# Patient Record
Sex: Male | Born: 1985 | Hispanic: Yes | Marital: Single | Smoking: Never smoker
Health system: Southern US, Community
[De-identification: ages and names within clinical notes are randomized; demographics above are authoritative.]

---

## 2017-11-23 ENCOUNTER — Emergency Department

## 2017-11-23 ENCOUNTER — Other Ambulatory Visit: Payer: Self-pay

## 2017-11-23 ENCOUNTER — Emergency Department
Admission: EM | Admit: 2017-11-23 | Discharge: 2017-11-23 | Attending: Emergency Medicine | Admitting: Emergency Medicine

## 2017-11-23 DIAGNOSIS — S0240FA Zygomatic fracture, left side, initial encounter for closed fracture: Secondary | ICD-10-CM | POA: Diagnosis not present

## 2017-11-23 DIAGNOSIS — Y92149 Unspecified place in prison as the place of occurrence of the external cause: Secondary | ICD-10-CM | POA: Diagnosis not present

## 2017-11-23 DIAGNOSIS — Y939 Activity, unspecified: Secondary | ICD-10-CM | POA: Diagnosis not present

## 2017-11-23 DIAGNOSIS — S0990XA Unspecified injury of head, initial encounter: Secondary | ICD-10-CM | POA: Diagnosis present

## 2017-11-23 DIAGNOSIS — S01112A Laceration without foreign body of left eyelid and periocular area, initial encounter: Secondary | ICD-10-CM | POA: Diagnosis not present

## 2017-11-23 DIAGNOSIS — Z23 Encounter for immunization: Secondary | ICD-10-CM | POA: Diagnosis not present

## 2017-11-23 DIAGNOSIS — Y999 Unspecified external cause status: Secondary | ICD-10-CM | POA: Diagnosis not present

## 2017-11-23 DIAGNOSIS — S0181XA Laceration without foreign body of other part of head, initial encounter: Secondary | ICD-10-CM

## 2017-11-23 DIAGNOSIS — S0083XA Contusion of other part of head, initial encounter: Secondary | ICD-10-CM

## 2017-11-23 MED ORDER — TETANUS-DIPHTH-ACELL PERTUSSIS 5-2.5-18.5 LF-MCG/0.5 IM SUSP
0.5000 mL | Freq: Once | INTRAMUSCULAR | Status: AC
  Administered 2017-11-23: 0.5 mL via INTRAMUSCULAR
  Filled 2017-11-23: qty 0.5

## 2017-11-23 MED ORDER — HYDROMORPHONE HCL 1 MG/ML IJ SOLN
2.0000 mg | Freq: Once | INTRAMUSCULAR | Status: AC
  Administered 2017-11-23: 2 mg via INTRAMUSCULAR
  Filled 2017-11-23: qty 2

## 2017-11-23 MED ORDER — OXYCODONE-ACETAMINOPHEN 5-325 MG PO TABS
2.0000 | ORAL_TABLET | Freq: Once | ORAL | Status: AC
  Administered 2017-11-23: 2 via ORAL
  Filled 2017-11-23: qty 2

## 2017-11-23 MED ORDER — LIDOCAINE-EPINEPHRINE 1 %-1:100000 IJ SOLN
10.0000 mL | Freq: Once | INTRAMUSCULAR | Status: DC
  Administered 2017-11-23: 10 mL via INTRADERMAL
  Filled 2017-11-23: qty 10

## 2017-11-23 MED ORDER — IBUPROFEN 600 MG PO TABS
600.0000 mg | ORAL_TABLET | Freq: Once | ORAL | Status: AC
  Administered 2017-11-23: 600 mg via ORAL
  Filled 2017-11-23: qty 1

## 2017-11-23 MED ORDER — LIDOCAINE-EPINEPHRINE (PF) 2 %-1:200000 IJ SOLN: 10.0000 mL | Freq: Once | INTRAMUSCULAR | Status: DC

## 2017-11-23 MED ORDER — LIDOCAINE-EPINEPHRINE (PF) 1 %-1:200000 IJ SOLN
INTRAMUSCULAR | Status: AC
  Filled 2017-11-23: qty 30

## 2017-11-23 NOTE — ED Notes (Addendum)
Denies loss of consciousness - denies change in vision - c/o nausea and abd pain "feel strange" - reports dizziness Interpreter is at bedside for triage and assessment - Dr Lamont Snowballifenbark is also at bedside - Dr Lamont Snowballifenbark removed C-Collar
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
## 2017-11-23 NOTE — ED Notes (Signed)
Lidocaine VO was for lidocaine with epi 1% 1:200000

## 2017-11-23 NOTE — ED Notes (Signed)
C Collar applied on arrival.
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
## 2023-01-20 ENCOUNTER — Emergency Department
Admission: EM | Admit: 2023-01-20 | Discharge: 2023-01-21 | Disposition: A | Discharge status: Other Acute Inpatient Hospital | Attending: Emergency Medicine | Admitting: Emergency Medicine

## 2023-01-20 ENCOUNTER — Other Ambulatory Visit: Payer: Self-pay

## 2023-01-20 DIAGNOSIS — F23 Brief psychotic disorder: Secondary | ICD-10-CM | POA: Insufficient documentation

## 2023-01-20 DIAGNOSIS — F29 Unspecified psychosis not due to a substance or known physiological condition: Secondary | ICD-10-CM

## 2023-01-20 DIAGNOSIS — R45851 Suicidal ideations: Secondary | ICD-10-CM | POA: Insufficient documentation

## 2023-01-20 LAB — URINE DRUG SCREEN, QUALITATIVE (ARMC ONLY)
Amphetamines, Ur Screen: NOT DETECTED
Barbiturates, Ur Screen: NOT DETECTED
Benzodiazepine, Ur Scrn: NOT DETECTED
Cannabinoid 50 Ng, Ur ~~LOC~~: POSITIVE — AB
Cocaine Metabolite,Ur ~~LOC~~: NOT DETECTED
MDMA (Ecstasy)Ur Screen: NOT DETECTED
Methadone Scn, Ur: NOT DETECTED
Opiate, Ur Screen: NOT DETECTED
Phencyclidine (PCP) Ur S: NOT DETECTED
Tricyclic, Ur Screen: NOT DETECTED

## 2023-01-20 LAB — COMPREHENSIVE METABOLIC PANEL
ALT: 27 U/L (ref 0–44)
AST: 23 U/L (ref 15–41)
Albumin: 4.5 g/dL (ref 3.5–5.0)
Alkaline Phosphatase: 88 U/L (ref 38–126)
Anion gap: 12 (ref 5–15)
BUN: 25 mg/dL — ABNORMAL HIGH (ref 6–20)
CO2: 22 mmol/L (ref 22–32)
Calcium: 9.2 mg/dL (ref 8.9–10.3)
Chloride: 103 mmol/L (ref 98–111)
Creatinine, Ser: 1.2 mg/dL (ref 0.61–1.24)
GFR, Estimated: >60 mL/min (ref >60)
Glucose, Bld: 120 mg/dL — ABNORMAL HIGH (ref 70–99)
Potassium: 3.5 mmol/L (ref 3.5–5.1)
Sodium: 137 mmol/L (ref 135–145)
Total Bilirubin: 0.5 mg/dL (ref 0.3–1.2)
Total Protein: 8.1 g/dL (ref 6.5–8.1)

## 2023-01-20 LAB — CBC
HCT: 41.3 % (ref 39.0–52.0)
Hemoglobin: 13.7 g/dL (ref 13.0–17.0)
MCH: 28.6 pg (ref 26.0–34.0)
MCHC: 33.2 g/dL (ref 30.0–36.0)
MCV: 86.2 fL (ref 80.0–100.0)
Platelets: 367 10*3/uL (ref 150–400)
RBC: 4.79 MIL/uL (ref 4.22–5.81)
RDW: 12.9 % (ref 11.5–15.5)
WBC: 14.1 10*3/uL — ABNORMAL HIGH (ref 4.0–10.5)
nRBC: 0 % (ref 0.0–0.2)

## 2023-01-20 LAB — ETHANOL: Alcohol, Ethyl (B): <10 mg/dL (ref <10)

## 2023-01-20 LAB — SALICYLATE LEVEL: Salicylate Lvl: <7 mg/dL — ABNORMAL LOW (ref 7.0–30.0)

## 2023-01-20 LAB — ACETAMINOPHEN LEVEL: Acetaminophen (Tylenol), Serum: <10 ug/mL — ABNORMAL LOW (ref 10–30)

## 2023-01-20 NOTE — BH Assessment (Signed)
Comprehensive Clinical Assessment (CCA) Note  01/20/2023 [facility] Elijah Ryan [id number]  Chief Complaint: Patient is a 37 year old male presenting to [facility] ED under IVC. Per triage note Pt to ed from magistrates office under IVC via BPD. Family called and had papers taken out bc he was speaking out of his head. Pt stated things like " I'm going to take over the world". Pt is alert and tracking appropriately in triage. During assessment patient appears alert and oriented x2, cooperative but manic and anxious. Patient was assessed via interpreter. Patient reports why he is presenting to the ED "you guys brought me here, I want to go back to my country." Patients thoughts were disorganized during the assessment with the interpreter, patient was difficult to redirect when asked questions. Patient reports "my hands are free, right now my mother and daughter are sick, I don't make enough money here to support my mother and daughter." When asked about AH he reports "a lot of voices, I'm here to get a job, I crossed the border, you are not giving me a job, I came from [place]." When asked about SI he reports "I am waiting for Jesus to take me, I'm giving my soul to hell, I am golden." When asked about taking any medications he reports "I'm not sick." Unable to assess if patient is experiencing HI/VH due to patient's disorganized thoughts.  Per Psyc NP Lerry Liner patient is recommended for Inpatient Chief Complaint  Patient presents with   Psychiatric Evaluation    IVC   Visit Diagnosis: Acute Psychosis    CCA Screening, Triage and Referral (STR)  Patient Reported Information How did you hear about [place]? Legal System  Referral name: No data recorded Referral phone number: No data recorded  Whom do you see for routine medical problems? No data recorded Practice/Facility Name: No data recorded Practice/Facility Phone Number: No data recorded Name of Contact: No data recorded Contact Number: No  data recorded Contact Fax Number: No data recorded Prescriber Name: No data recorded Prescriber Address (if known): No data recorded  What Is the Reason for Your Visit/Call Today? Pt to ed from magistrates office under IVC via BPD. Family called and had papers taken out bc he was speaking out of his head. Pt stated things like " I'm going to take over the world". Pt is alert and tracking appropriately in triage.  How Long Has This Been Causing You Problems? > than 6 months  What Do You Feel Would Help You the Most Today? No data recorded  Have You Recently Been in Any Inpatient Treatment (Hospital/Detox/Crisis Center/28-Day Program)? No data recorded Name/Location of Program/Hospital:No data recorded How Long Were You There? No data recorded When Were You Discharged? No data recorded  Have You Ever Received Services From [facility] Before? No data recorded Who Do You See at [facility]? No data recorded  Have You Recently Had Any Thoughts About Hurting Yourself? -- (UTA)  Are You Planning to Commit Suicide/Harm Yourself At This time? -- (UTA)   Have you Recently Had Thoughts About Hurting Someone Else? -- (UTA)  Explanation: No data recorded  Have You Used Any Alcohol or Drugs in the Past 24 Hours? Yes  How Long Ago Did You Use Drugs or Alcohol? No data recorded What Did You Use and How Much? Marijuana   Do You Currently Have a Therapist/Psychiatrist? No  Name of Therapist/Psychiatrist: No data recorded  Have You Been Recently Discharged From Any Office Practice or Programs?  No  Explanation of Discharge From Practice/Program: No data recorded    CCA Screening Triage Referral Assessment Type of Contact: Face-to-Face  Is this Initial or Reassessment? No data recorded Date Telepsych consult ordered in [facility]:  No data recorded Time Telepsych consult ordered in [facility]:  No data recorded  Patient Reported Information Reviewed? No data recorded Patient Left Without Being Seen?  No data recorded Reason for Not Completing Assessment: No data recorded  Collateral Involvement: No data recorded  Does Patient Have a Court Appointed Legal Guardian? No data recorded Name and Contact of Legal Guardian: No data recorded If Minor and Not Living with Parent(s), Who has Custody? No data recorded Is CPS involved or ever been involved? Never  Is APS involved or ever been involved? Never   Patient Determined To Be At Risk for Harm To Self or Others Based on Review of Patient Reported Information or Presenting Complaint? No  Method: No data recorded Availability of Means: No data recorded Intent: No data recorded Notification Required: No data recorded Additional Information for Danger to Others Potential: No data recorded Additional Comments for Danger to Others Potential: No data recorded Are There Guns or Other Weapons in Your Home? No  Types of Guns/Weapons: No data recorded Are These Weapons Safely Secured?                            No data recorded Who Could Verify You Are Able To Have These Secured: No data recorded Do You Have any Outstanding Charges, Pending Court Dates, Parole/Probation? No data recorded Contacted To Inform of Risk of Harm To Self or Others: No data recorded  Location of Assessment:  [facility] ED   Does Patient Present under Involuntary Commitment? Yes  IVC Papers Initial File Date: No data recorded  [place] of Residence: [place]   Patient Currently Receiving the Following Services: No data recorded  Determination of Need: Emergent (2 hours)   Options For Referral: No data recorded    CCA Biopsychosocial Intake/Chief Complaint:  No data recorded Current Symptoms/Problems: No data recorded  Patient Reported Schizophrenia/Schizoaffective Diagnosis in Past: No   Strengths: Patient is able to communicate  Preferences: No data recorded Abilities: No data recorded  Type of Services Patient Feels are Needed: No data  recorded  Initial Clinical Notes/Concerns: No data recorded  Mental Health Symptoms Depression:   None   Duration of Depressive symptoms: No data recorded  Mania:   Change in energy/activity; Increased Energy; Racing thoughts; Recklessness   Anxiety:    Difficulty concentrating   Psychosis:   Delusions; Hallucinations   Duration of Psychotic symptoms:  Greater than six months   Trauma:   None   Obsessions:   Poor insight; Recurrent & persistent thoughts/impulses/images   Compulsions:   Absent insight/delusional; Poor Insight; Repeated behaviors/mental acts   Inattention:   Disorganized   Hyperactivity/Impulsivity:   None   Oppositional/Defiant Behaviors:   None   Emotional Irregularity:   Mood lability; Transient, stress-related paranoia/disassociation   Other Mood/Personality Symptoms:  No data recorded   Mental Status Exam Appearance and self-care  Stature:   Average   Weight:   Average weight   Clothing:   Casual   Grooming:   Normal   Cosmetic use:   None   Posture/gait:   Normal   Motor activity:   Not Remarkable   Sensorium  Attention:   Distractible   Concentration:   Anxiety interferes; Focuses on irrelevancies; Scattered  Orientation:   Person; Place   Recall/memory:   Defective in Short-term   Affect and Mood  Affect:   Anxious   Mood:   Anxious   Relating  Eye contact:   Normal   Facial expression:   Responsive   Attitude toward examiner:   Cooperative   Thought and Language  Speech flow:  Flight of Ideas   Thought content:   Delusions   Preoccupation:   Ruminations; Religion; Obsessions   Hallucinations:   Auditory   Organization:  No data recorded  [employer] of Knowledge:   Fair   Intelligence:   Average   Abstraction:   Functional   Judgement:   Poor   Reality Testing:   Distorted   Insight:   Lacking; Poor   Decision Making:   Impulsive   Social  Functioning  Social Maturity:   Impulsive   Social Judgement:   Heedless   Stress  Stressors:   Family conflict   Coping Ability:   Contractor Deficits:   None   Supports:   Family     Religion: Religion/Spirituality Are You A Religious Person?: Yes  Leisure/Recreation: Leisure / Recreation Do You Have Hobbies?: No  Exercise/Diet: Exercise/Diet Do You Exercise?: No Have You Gained or Lost A Significant Amount of Weight in the Past Six Months?: No Do You Follow a Special Diet?: No Do You Have Any Trouble Sleeping?:  (Unknown)   CCA Employment/Education Employment/Work Situation: Employment / Work Situation Employment Situation:  (Unknown) Patient's Job has Been Impacted by Current Illness: No Has Patient ever Been in the [employer]?:  Industrial/product designer)  Education: Education Is Patient Currently Attending School?: No Did You Have An Individualized Education Program (IIEP): No Did You Have Any Difficulty At School?: No Patient's Education Has Been Impacted by Current Illness: No   CCA Family/Childhood History Family and Relationship History: Family history Marital status: Single Does patient have children?:  (Unknown)  Childhood History:  Childhood History Did patient suffer any verbal/emotional/physical/sexual abuse as a child?:  (UTA) Did patient suffer from severe childhood neglect?:  (UTA) Has patient ever been sexually abused/assaulted/raped as an adolescent or adult?:  (UTA) Was the patient ever a victim of a crime or a disaster?:  (UTA) Witnessed domestic violence?:  (UTA) Has patient been affected by domestic violence as an adult?:  Industrial/product designer)  Child/Adolescent Assessment:     CCA Substance Use Alcohol/Drug Use: Alcohol / Drug Use Pain Medications: SEE MAR Prescriptions: SEE MAR Over the Counter: SEE MAR History of alcohol / drug use?: Yes Substance #1 Name of Substance 1: Marijuana                       ASAM's:  Six Dimensions of  Multidimensional Assessment  Dimension 1:  Acute Intoxication and/or Withdrawal Potential:      Dimension 2:  Biomedical Conditions and Complications:      Dimension 3:  Emotional, Behavioral, or Cognitive Conditions and Complications:     Dimension 4:  Readiness to Change:     Dimension 5:  Relapse, Continued use, or Continued Problem Potential:     Dimension 6:  Recovery/Living Environment:     ASAM Severity Score:    ASAM Recommended Level of Treatment:     Substance use Disorder (SUD)    Recommendations for Services/Supports/Treatments:    DSM5 Diagnoses: Patient Active Problem List   Diagnosis Date Noted   Acute psychosis (HCC) 01/20/2023    Patient  Centered Plan: Patient is on the following Treatment Plan(s):  Impulse Control   Referrals to Alternative Service(s): Referred to Alternative Service(s):   Place:   Date:   Time:    Referred to Alternative Service(s):   Place:   Date:   Time:    Referred to Alternative Service(s):   Place:   Date:   Time:    Referred to Alternative Service(s):   Place:   Date:   Time:      @[facility]LL[facility] @  [employer], LCAS-A

## 2023-01-20 NOTE — Consult Note (Cosign Needed Addendum)
[facility] Face-to-Face Psychiatry Consult   Reason for Consult:  Psychiatric Evaluation  Referring Physician:  Dr. Modesto Charon Patient Identification: Elijah Ryan Elijah Ryan MRN:  [id number] Principal Diagnosis: Acute psychosis [facility]) Diagnosis:  Principal Problem:   Acute psychosis (HCC)   Total Time spent with patient: 45 minutes  Subjective:   " I am free and I need to be free"  HPI:  Psych Assessment  Elijah Elijah Ryan, 37 y.o., male patient seen via tele health by TTS and this provider; chart reviewed and consulted with Dr. Modesto Charon  on 01/20/23.  On evaluation Elijah Ryan reports (spanish interpreter) that he is here because "we" brought him here.  He says he wants to go back to his country where he is free to make money and feed his children.   Patient is hyperverbal while talking to the interpreter.  He goes on for a long time about how he is free and that he just came here for a job and that we are trying to prevent him from making money. When asked if he wanted to hurt himself, patient states that he would not do that because Jesus owns his body but that he has already given himself to the devil.  No much of what patient says makes sense.    Per chart review, triage note states, Pt to ed from magistrates office under IVC via BPD. Family called and had papers taken out bc he was speaking out of his head. Pt stated things like " I'm going to take over the world". Pt is alert and tracking appropriately in triage. EDP HPI states, Elijah Ryan is a 37 y.o. male   Past medical history of no known past medical history presents to the emergency department under IVC by family for reportedly manic behavior, pacing, seeing devils, saying that he is God and that he will take over the world.  When I meet with the patient he has no acute medical complaints and states that he just wants to go home and is looking and he will make any decision he wants and "be free"  He states that  he smokes tobacco and marijuana but denies any other drug use.  He denies any self-harm, suicidality, homicidality or audiovisual hallucinations.  Per TTS, Family called and had papers taken out bc he was speaking out of his head. Pt stated things like " I'm going to take over the world". Pt is alert and tracking appropriately in triage. During assessment patient appears alert and oriented x2, cooperative but manic and anxious. Patient was assessed via interpreter. Patient reports why he is presenting to the ED "you guys brought me here, I want to go back to my country." Patients thoughts were disorganized during the assessment with the interpreter, patient was difficult to redirect when asked questions. Patient reports "my hands are free, right now my mother and daughter are sick, I don't make enough money here to support my mother and daughter." When asked about AH he reports "a lot of voices, I'm here to get a job, I crossed the border, you are not giving me a job, I came from [place]." When asked about SI he reports "I am waiting for Jesus to take me, I'm giving my soul to hell, I am golden." When asked about taking any medications he reports "I'm not sick." Unable to assess if patient is experiencing HI/VH due to patient's disorganized thoughts.    During evaluation [facility] Elijah Ryan is laying  in bed;  he is alert/oriented x 3; hyper/cooperative; and mood congruent with affect.  Patient is speaking in a fast pace at moderate volume; with fair eye contact.  His thought process is incoherent and irrelevant; There is no indication that he is currently responding to internal/external stimuli.  But that he is  experiencing delusional thought content.  Patient denies suicidal/self-harm  and homicidal ideation. However he appears to be psychotic, and paranoid.  UDS is +marijuanna   Recommend Inpatient psychiatric hospitalization  Past Psychiatric History: Unknown  Risk to Self:   Risk to Others:    Prior Inpatient Therapy:   Prior Outpatient Therapy:    Past Medical History: History reviewed. No pertinent past medical history. History reviewed. No pertinent surgical history. Family History: History reviewed. No pertinent family history. Family Psychiatric  History: unknown Social History:  Social History   Substance and Sexual Activity  Alcohol Use Not Currently     Social History   Substance and Sexual Activity  Drug Use Not Currently    Social History   Socioeconomic History   Marital status: Single    Spouse name: Not on file   Number of children: Not on file   Years of education: Not on file   Highest education level: Not on file  Occupational History   Not on file  Tobacco Use   Smoking status: Never   Smokeless tobacco: Never  Substance and Sexual Activity   Alcohol use: Not Currently   Drug use: Not Currently   Sexual activity: Not on file  Other Topics Concern   Not on file  Social History Narrative   Not on file   Social Determinants of Health   Financial Resource Strain: Not on file  Food Insecurity: Not on file  Transportation Needs: Not on file  Physical Activity: Not on file  Stress: Not on file  Social Connections: Not on file   Additional Social History:    Allergies:  No Known Allergies  Labs:  Results for orders placed or performed during the hospital encounter of 01/20/23 (from the past 48 hour(s))  Comprehensive metabolic panel     Status: Abnormal   Collection Time: 01/20/23  8:58 PM  Result Value Ref Range   Sodium 137 135 - 145 mmol/L   Potassium 3.5 3.5 - 5.1 mmol/L   Chloride 103 98 - 111 mmol/L   CO2 22 22 - 32 mmol/L   Glucose, Bld 120 (H) 70 - 99 mg/dL    Comment: Glucose reference range applies only to samples taken after fasting for at least 8 hours.   BUN 25 (H) 6 - 20 mg/dL   Creatinine, Ser 4.13 0.61 - 1.24 mg/dL   Calcium 9.2 8.9 - 24.4 mg/dL   Total Protein 8.1 6.5 - 8.1 g/dL   Albumin 4.5 3.5 - 5.0 g/dL    AST 23 15 - 41 U/L   ALT 27 0 - 44 U/L   Alkaline Phosphatase 88 38 - 126 U/L   Total Bilirubin 0.5 0.3 - 1.2 mg/dL   GFR, Estimated >01 >02 mL/min    Comment: (NOTE) Calculated using the CKD-EPI Creatinine Equation (2021)    Anion gap 12 5 - 15    Comment: Performed at [facility], [address] [place], [place] [address]  Ethanol     Status: None   Collection Time: 01/20/23  8:58 PM  Result Value Ref Range   Alcohol, Ethyl (B) <10 <10 mg/dL    Comment: (NOTE)  Lowest detectable limit for serum alcohol is 10 mg/dL.  For medical purposes only. Performed at [facility], [address] [place], [place] [address]   Salicylate level     Status: Abnormal   Collection Time: 01/20/23  8:58 PM  Result Value Ref Range   Salicylate Lvl <7.0 (L) 7.0 - 30.0 mg/dL    Comment: Performed at [facility], [address] [place], [place] [address]  Acetaminophen level     Status: Abnormal   Collection Time: 01/20/23  8:58 PM  Result Value Ref Range   Acetaminophen (Tylenol), Serum <10 (L) 10 - 30 ug/mL    Comment: (NOTE) Therapeutic concentrations vary significantly. A range of 10-30 ug/mL  may be an effective concentration for many patients. However, some  are best treated at concentrations outside of this range. Acetaminophen concentrations >150 ug/mL at 4 hours after ingestion  and >50 ug/mL at 12 hours after ingestion are often associated with  toxic reactions.  Performed at [facility], [address] [place], [place] [address]   cbc     Status: Abnormal   Collection Time: 01/20/23  8:58 PM  Result Value Ref Range   WBC 14.1 (H) 4.0 - 10.5 K/uL   RBC 4.79 4.22 - 5.81 MIL/uL   Hemoglobin 13.7 13.0 - 17.0 g/dL   HCT 02.7 25.3 - 66.4 %   MCV 86.2 80.0 - 100.0 fL   MCH 28.6 26.0 - 34.0 pg   MCHC 33.2 30.0 - 36.0 g/dL   RDW 40.3 47.4 - 25.9 %   Platelets 367 150 - 400 K/uL   nRBC 0.0 0.0 - 0.2 %    Comment: Performed at [facility], [address], [place], [place] [address]  Urine Drug Screen, Qualitative     Status: Abnormal   Collection Time: 01/20/23 10:12 PM  Result Value Ref Range   Tricyclic, Ur Screen NONE DETECTED NONE DETECTED   Amphetamines, Ur Screen NONE DETECTED NONE DETECTED   MDMA (Ecstasy)Ur Screen NONE DETECTED NONE DETECTED   Cocaine Metabolite,Ur [place] NONE DETECTED NONE DETECTED   Opiate, Ur Screen NONE DETECTED NONE DETECTED   Phencyclidine (PCP) Ur S NONE DETECTED NONE DETECTED   Cannabinoid 50 Ng, Ur [place] POSITIVE (A) NONE DETECTED   Barbiturates, Ur Screen NONE DETECTED NONE DETECTED   Benzodiazepine, Ur Scrn NONE DETECTED NONE DETECTED   Methadone Scn, Ur NONE DETECTED NONE DETECTED    Comment: (NOTE) Tricyclics + metabolites, urine    Cutoff 1000 ng/mL Amphetamines + metabolites, urine  Cutoff 1000 ng/mL MDMA (Ecstasy), urine              Cutoff 500 ng/mL Cocaine Metabolite, urine          Cutoff 300 ng/mL Opiate + metabolites, urine        Cutoff 300 ng/mL Phencyclidine (PCP), urine         Cutoff 25 ng/mL Cannabinoid, urine                 Cutoff 50 ng/mL Barbiturates + metabolites, urine  Cutoff 200 ng/mL Benzodiazepine, urine              Cutoff 200 ng/mL Methadone, urine                   Cutoff 300 ng/mL  The urine drug screen provides only a preliminary, unconfirmed analytical test result and should not be used for non-medical purposes. Clinical consideration and professional judgment should be  applied to any positive drug screen result due to possible interfering substances. A more specific alternate chemical method must be used in order to obtain a confirmed analytical result. Gas chromatography / mass spectrometry (GC/MS) is the preferred confirm atory method. Performed at [facility], [address] [place], [place] [address]     No current facility-administered medications for this encounter.   No current outpatient medications on file.     Musculoskeletal: Strength & Muscle Tone: within normal limits Gait & Station: normal Patient leans: N/A            Psychiatric Specialty Exam:  Presentation  General Appearance: No data recorded Eye Contact:No data recorded Speech:No data recorded Speech Volume:No data recorded Handedness:No data recorded  Mood and Affect  Mood:No data recorded Affect:No data recorded  Thought Process  Thought Processes:No data recorded Descriptions of Associations:No data recorded Orientation:No data recorded Thought Content:No data recorded History of Schizophrenia/Schizoaffective disorder:No  Duration of Psychotic Symptoms:Greater than six months  Hallucinations:No data recorded Ideas of Reference:No data recorded Suicidal Thoughts:No data recorded Homicidal Thoughts:No data recorded  Sensorium  Memory:No data recorded Judgment:No data recorded Insight:No data recorded  Executive Functions  Concentration:No data recorded Attention Span:No data recorded Recall:No data recorded Fund of Knowledge:No data recorded Language:No data recorded  Psychomotor Activity  Psychomotor Activity:No data recorded  Assets  Assets:No data recorded  Sleep  Sleep:No data recorded  Physical Exam: Physical Exam Vitals and nursing note reviewed.  Constitutional:      Appearance: Normal appearance.  HENT:     Head: Normocephalic and atraumatic.     Nose: Nose normal.     Mouth/Throat:     Mouth: Mucous membranes are dry.  Eyes:     Pupils: Pupils are equal, round, and reactive to light.  Pulmonary:     Effort: Pulmonary effort is normal.  Musculoskeletal:        General: Normal range of motion.     Cervical back: Normal range of motion.  Skin:    General: Skin is dry.  Neurological:     Mental Status: He is alert and oriented to person, place, and time.  Psychiatric:        Mood and Affect: Mood is anxious. Affect is inappropriate.        Speech: Speech normal.         Behavior: Behavior is hyperactive. Behavior is cooperative.        Thought Content: Thought content is delusional. Thought content includes suicidal ideation. Thought content includes suicidal plan.        Cognition and Memory: Cognition is impaired.        Judgment: Judgment is impulsive and inappropriate.    Review of Systems  Psychiatric/Behavioral:  Positive for hallucinations. Negative for suicidal ideas.   All other systems reviewed and are negative.  Blood pressure (!) 133/92, pulse 95, temperature 98.4 F (36.9 C), temperature source Oral, resp. rate 16, height 5\' 5"  (1.651 m), weight 75 kg, SpO2 98 %. Body mass index is 27.51 kg/m.  Treatment Plan Summary: Daily contact with patient to assess and evaluate symptoms and progress in treatment, Medication management, and Plan  [facility] Oliva Berdan was admitted for Acute psychosis [facility]), crisis management, and stabilization. Routine labs ordered, which include Lab Orders         Comprehensive metabolic panel         Ethanol         Salicylate level  Acetaminophen level         cbc         Urine Drug Screen, Qualitative    Medication Management: Medications started  Will maintain observation checks every 15 minutes for safety. Psychosocial education regarding relapse prevention and self-care; social and communication  Social work will consult with family for collateral information and discuss discharge and follow up plan.  Disposition: Recommend psychiatric Inpatient admission when medically cleared. Supportive therapy provided about ongoing stressors. Discussed crisis plan, support from social network, calling 911, coming to the Emergency Department, and calling Suicide Hotline.  Jearld Lesch, NP 01/20/2023 11:29 PM

## 2023-01-20 NOTE — ED Provider Notes (Signed)
[facility] Provider Note    Event Date/Time   First MD Initiated Contact with Patient 01/20/23 2112     (approximate)   History   Psychiatric Evaluation (IVC)   HPI  [facility] Elijah Ryan is a 37 y.o. male   Past medical history of no known past medical history presents to the emergency department under IVC by family for reportedly manic behavior, pacing, seeing devils, saying that he is God and that he will take over the world.  When I meet with the patient he has no acute medical complaints and states that he just wants to go home and is looking and he will make any decision he wants and "be free"   He states that he smokes tobacco and marijuana but denies any other drug use.  He denies any self-harm, suicidality, homicidality or audiovisual hallucinations.  External Medical Documents Reviewed: IVC paperwork documented above concerns of manic behavior      Physical Exam   Triage Vital Signs: ED Triage Vitals [01/20/23 2054]  Enc Vitals Group     BP (!) 133/92     Pulse Rate 95     Resp 16     Temp 98.4 F (36.9 C)     Temp Source Oral     SpO2 98 %     Weight 165 lb 5.5 oz (75 kg)     Height 5\' 5"  (1.651 m)     Head Circumference      Peak Flow      Pain Score 0     Pain Loc      Pain Edu?      Excl. in GC?     Most recent vital signs: Vitals:   01/20/23 2054  BP: (!) 133/92  Pulse: 95  Resp: 16  Temp: 98.4 F (36.9 C)  SpO2: 98%    General: Awake, no distress.  CV:  Good peripheral perfusion.  Resp:  Normal effort.  Abd:  No distention.  Other:  Awake alert cooperative, fast speech that is difficult to interrupt.  He has no obvious signs of trauma he appears well moving all extremities.   ED Results / Procedures / Treatments   Labs (all labs ordered are listed, but only abnormal results are displayed) Labs Reviewed  COMPREHENSIVE METABOLIC PANEL - Abnormal; Notable for the following components:      Result Value    Glucose, Bld 120 (*)    BUN 25 (*)    All other components within normal limits  SALICYLATE LEVEL - Abnormal; Notable for the following components:   Salicylate Lvl <7.0 (*)    All other components within normal limits  ACETAMINOPHEN LEVEL - Abnormal; Notable for the following components:   Acetaminophen (Tylenol), Serum <10 (*)    All other components within normal limits  CBC - Abnormal; Notable for the following components:   WBC 14.1 (*)    All other components within normal limits  ETHANOL  URINE DRUG SCREEN, QUALITATIVE ([facility] ONLY)     I ordered and reviewed the above labs they are notable for white blood cell count is elevated at 14, Tylenol and salicylate levels are normal.   PROCEDURES:  Critical Care performed: No  Procedures   MEDICATIONS ORDERED IN ED: Medications - No data to display  IMPRESSION / MDM / ASSESSMENT AND PLAN / ED COURSE  I reviewed the triage vital signs and the nursing notes.  Patient's presentation is most consistent with acute presentation with potential threat to life or bodily function.  Differential diagnosis includes, but is not limited to, acute decompensated psychiatric illness, manic episode, substance-induced mood disorder   MDM: Patient reports no acute medical complaints but is here particularly IVC due to manic behavior and he exhibits signs of dyspnea on my examination, difficult rambling speech, and stating at times that he is "the [place]" - no drug use reported and no ingestions reported, no SI. Basic labs and tox labs ordered and reviewed; psych consult.        FINAL CLINICAL IMPRESSION(S) / ED DIAGNOSES   Final diagnoses:  Psychosis, unspecified psychosis type (HCC)     Rx / DC Orders   ED Discharge Orders     None        Note:  This document was prepared using Dragon voice recognition software and may include unintentional dictation errors.    Pilar Jarvis, MD 01/20/23  2206

## 2023-01-20 NOTE — ED Triage Notes (Signed)
Pt to ed from magistrates office under IVC via BPD. Family called and had papers taken out bc he was speaking out of his head. Pt stated things like " I'm going to take over the world". Pt is alert and tracking appropriately in triage.

## 2023-01-20 NOTE — ED Notes (Addendum)
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

## 2023-01-21 ENCOUNTER — Encounter (HOSPITAL_COMMUNITY): Payer: Self-pay | Admitting: Psychiatric/Mental Health

## 2023-01-21 ENCOUNTER — Inpatient Hospital Stay (HOSPITAL_COMMUNITY)
Admission: AD | Admit: 2023-01-21 | Discharge: 2023-01-29 | DRG: 885 | Disposition: A | Discharge status: Home / Self Care | Payer: No Typology Code available for payment source | Source: Intra-hospital | Attending: Psychiatry | Admitting: Psychiatry

## 2023-01-21 DIAGNOSIS — F29 Unspecified psychosis not due to a substance or known physiological condition: Principal | ICD-10-CM | POA: Diagnosis present

## 2023-01-21 DIAGNOSIS — F1220 Cannabis dependence, uncomplicated: Secondary | ICD-10-CM | POA: Diagnosis present

## 2023-01-21 DIAGNOSIS — I10 Essential (primary) hypertension: Secondary | ICD-10-CM | POA: Diagnosis present

## 2023-01-21 DIAGNOSIS — F1511 Other stimulant abuse, in remission: Secondary | ICD-10-CM | POA: Diagnosis present

## 2023-01-21 DIAGNOSIS — Z555 Less than a high school diploma: Secondary | ICD-10-CM | POA: Diagnosis not present

## 2023-01-21 DIAGNOSIS — F209 Schizophrenia, unspecified: Secondary | ICD-10-CM | POA: Diagnosis present

## 2023-01-21 DIAGNOSIS — F1510 Other stimulant abuse, uncomplicated: Secondary | ICD-10-CM | POA: Diagnosis present

## 2023-01-21 DIAGNOSIS — Z560 Unemployment, unspecified: Secondary | ICD-10-CM | POA: Diagnosis not present

## 2023-01-21 DIAGNOSIS — Z79899 Other long term (current) drug therapy: Secondary | ICD-10-CM | POA: Diagnosis not present

## 2023-01-21 DIAGNOSIS — F23 Brief psychotic disorder: Principal | ICD-10-CM | POA: Diagnosis present

## 2023-01-21 DIAGNOSIS — G4700 Insomnia, unspecified: Secondary | ICD-10-CM | POA: Diagnosis present

## 2023-01-21 DIAGNOSIS — F419 Anxiety disorder, unspecified: Secondary | ICD-10-CM | POA: Diagnosis present

## 2023-01-21 DIAGNOSIS — F17210 Nicotine dependence, cigarettes, uncomplicated: Secondary | ICD-10-CM | POA: Diagnosis present

## 2023-01-21 MED ORDER — LORAZEPAM 1 MG PO TABS: 2.0000 mg | ORAL_TABLET | Freq: Three times a day (TID) | ORAL | Status: DC | PRN

## 2023-01-21 MED ORDER — DIPHENHYDRAMINE HCL 50 MG/ML IJ SOLN
25.0000 mg | Freq: Once | INTRAMUSCULAR | Status: DC
  Filled 2023-01-21: qty 1

## 2023-01-21 MED ORDER — HALOPERIDOL LACTATE 5 MG/ML IJ SOLN
5.0000 mg | Freq: Once | INTRAMUSCULAR | Status: DC
  Filled 2023-01-21: qty 1

## 2023-01-21 MED ORDER — LORAZEPAM 2 MG/ML IJ SOLN
1.0000 mg | Freq: Once | INTRAMUSCULAR | Status: DC
  Filled 2023-01-21: qty 1

## 2023-01-21 MED ORDER — TRAZODONE HCL 50 MG PO TABS
50.0000 mg | ORAL_TABLET | Freq: Every evening | ORAL | Status: DC | PRN
  Administered 2023-01-24 – 2023-01-28 (×5): 50 mg via ORAL
  Filled 2023-01-21 (×7): qty 1

## 2023-01-21 MED ORDER — MIDAZOLAM HCL (PF) 10 MG/2ML IJ SOLN: 5.0000 mg | Freq: Once | INTRAMUSCULAR | Status: AC

## 2023-01-21 MED ORDER — LORAZEPAM 2 MG PO TABS
2.0000 mg | ORAL_TABLET | ORAL | Status: AC | PRN
  Administered 2023-01-21: 2 mg via ORAL
  Filled 2023-01-21: qty 1

## 2023-01-21 MED ORDER — DROPERIDOL 2.5 MG/ML IJ SOLN
5.0000 mg | Freq: Once | INTRAMUSCULAR | Status: AC
  Administered 2023-01-21: 5 mg via INTRAMUSCULAR

## 2023-01-21 MED ORDER — ENSURE ENLIVE PO LIQD
237.0000 mL | Freq: Two times a day (BID) | ORAL | Status: DC
  Administered 2023-01-22 – 2023-01-29 (×4): 237 mL via ORAL
  Filled 2023-01-21 (×17): qty 237

## 2023-01-21 MED ORDER — OLANZAPINE 5 MG PO TBDP: 5.0000 mg | ORAL_TABLET | Freq: Three times a day (TID) | ORAL | Status: DC | PRN

## 2023-01-21 MED ORDER — ALUM & MAG HYDROXIDE-SIMETH 200-200-20 MG/5ML PO SUSP: 30.0000 mL | ORAL | Status: DC | PRN

## 2023-01-21 MED ORDER — DIPHENHYDRAMINE HCL 25 MG PO CAPS: 50.0000 mg | ORAL_CAPSULE | Freq: Three times a day (TID) | ORAL | Status: DC | PRN

## 2023-01-21 MED ORDER — MAGNESIUM HYDROXIDE 400 MG/5ML PO SUSP: 30.0000 mL | Freq: Every day | ORAL | Status: DC | PRN

## 2023-01-21 MED ORDER — DIPHENHYDRAMINE HCL 50 MG/ML IJ SOLN: 50.0000 mg | Freq: Three times a day (TID) | INTRAMUSCULAR | Status: DC | PRN

## 2023-01-21 MED ORDER — LORAZEPAM 2 MG/ML IJ SOLN: 2.0000 mg | Freq: Three times a day (TID) | INTRAMUSCULAR | Status: DC | PRN

## 2023-01-21 MED ORDER — LORAZEPAM 1 MG PO TABS: 1.0000 mg | ORAL_TABLET | ORAL | Status: DC | PRN

## 2023-01-21 MED ORDER — HALOPERIDOL LACTATE 5 MG/ML IJ SOLN: 5.0000 mg | Freq: Three times a day (TID) | INTRAMUSCULAR | Status: DC | PRN

## 2023-01-21 MED ORDER — HYDROXYZINE HCL 25 MG PO TABS
25.0000 mg | ORAL_TABLET | Freq: Three times a day (TID) | ORAL | Status: DC | PRN
  Filled 2023-01-21 (×2): qty 1

## 2023-01-21 MED ORDER — MIDAZOLAM HCL (PF) 10 MG/2ML IJ SOLN
INTRAMUSCULAR | Status: AC
  Administered 2023-01-21: 5 mg via INTRAMUSCULAR
  Filled 2023-01-21: qty 2

## 2023-01-21 MED ORDER — ACETAMINOPHEN 325 MG PO TABS: 650.0000 mg | ORAL_TABLET | Freq: Four times a day (QID) | ORAL | Status: DC | PRN

## 2023-01-21 MED ORDER — HALOPERIDOL 5 MG PO TABS: 5.0000 mg | ORAL_TABLET | Freq: Three times a day (TID) | ORAL | Status: DC | PRN

## 2023-01-21 NOTE — Progress Notes (Signed)
Patient ID: Elijah Ryan, male   DOB: 01/23/1986, 37 y.o.   MRN: [id number]   Initial Treatment Plan 01/21/2023 7:14 PM [facility] Zacharie Portner [id number]    PATIENT STRESSORS: Medication change or noncompliance     PATIENT STRENGTHS: Communication skills  Supportive family/friends    PATIENT IDENTIFIED PROBLEMS: Psychosis  Depression  Anxiety                 DISCHARGE CRITERIA:  Improved stabilization in mood, thinking, and/or behavior Reduction of life-threatening or endangering symptoms to within safe limits Verbal commitment to aftercare and medication compliance  PRELIMINARY DISCHARGE PLAN: Outpatient therapy Return to previous living arrangement Return to previous work or school arrangements  PATIENT/FAMILY INVOLVEMENT: This treatment plan has been presented to and reviewed with the patient, Elijah Ryan, and/or family member.  The patient and family have been given the opportunity to ask questions and make suggestions.  Tania Ade, RN 01/21/2023, 7:14 PM

## 2023-01-21 NOTE — ED Notes (Signed)
Pt ambulatory to restroom with steady gait. No distress noted.

## 2023-01-21 NOTE — ED Notes (Signed)
[facility]  Elijah Ryan  called  for  transport to  [facility]  MED

## 2023-01-21 NOTE — Consult Note (Cosign Needed Addendum)
Patient noted lying in the bed, pleasant and willing to engage, stating he is ready to go. "Hospital is not for me". Patient is primarily Spanish speaking. Patient previously recommended for inpatient psychiatry. He is awaiting transfer to inpatient psychiatry.

## 2023-01-21 NOTE — ED Notes (Signed)
Pt lying on stretcher with lights off. Agrees to be cooperative with staff and verbalized understanding of pending admission.

## 2023-01-21 NOTE — Progress Notes (Signed)
Patient ID: Elijah Ryan, male   DOB: 12-11-1985, 37 y.o.   MRN: [id number]   Pt alert and oriented during [facility] admission process. Pt denies SI/HI, AVH, and any pain. Pt is calm and cooperative. Admission completed with [facility] spanish interpreter.  Education, support, reassurance, and encouragement provided, q15 minute safety checks initiated. Pt's belongings in locker #29. Pt denies any concerns at this time, and verbally contracts for safety. Pt ambulating on the unit with no issues. Pt remains safe on and off the unit.

## 2023-01-21 NOTE — ED Notes (Signed)
Pt resting on stretcher with eyes closed and even respirations. No acute distress noted at this time. Hands visible and ED tech at the bedside frequently to ensure pt safety.

## 2023-01-21 NOTE — ED Notes (Signed)
Pt given graham crackers, peanut butter, and ice cream for snack per request.

## 2023-01-21 NOTE — ED Notes (Signed)
Pt wife out of room followed by patient, who is now speaking loudly and appears very agitated. Pt wife states she feels like she needs to leave and pt is becoming increasingly irritated, insisting that he is going home with her. Pt asking for his  belongings and speaking rapidly in spanish. Pt reassured by this nurse and interpreter brought to bedside to ensure effective communication.

## 2023-01-21 NOTE — BH Assessment (Signed)
PATIENT BED AVAILABLE PENDING DISCHARGES ON 01/21/23  Patient has been accepted to [facility]   Patient assigned to room 503 bed 2 Accepting physician is Dr. Sherron Flemings.  Call report to [phone].  Representative was [facility].   ER Staff is aware of it:  [facility] ER Secretary  Dr. Delton Prairie, ER MD  Dorian Patient's Nurse

## 2023-01-21 NOTE — BH Assessment (Signed)
Patient has been accepted to [facility] on today 01/21/23. Patient assigned to room 503, bed# 1. Accepting physician is Dr. Sherron Flemings.  Call report to [phone].  Representative was [employer].   ER Staff is aware of it:  Misty Stanley, ER Secretary  Dr. Erma Heritage, ER MD  Sue Lush, Patient's Nurse

## 2023-01-21 NOTE — Progress Notes (Signed)
   01/21/23 2200  Psych Admission Type (Psych Patients Only)  Admission Status Involuntary  Psychosocial Assessment  Patient Complaints Anxiety  Eye Contact Fair  Facial Expression Sad  Affect Appropriate to circumstance  Speech Logical/coherent  Interaction Cautious;Isolative  Motor Activity Slow  Appearance/Hygiene Disheveled  Behavior Characteristics Cooperative  Mood Depressed  Aggressive Behavior  Effect No apparent injury  Thought Process  Coherency WDL  Content WDL  Delusions WDL  Perception WDL  Hallucination None reported or observed  Judgment Impaired  Confusion None  Danger to Self  Current suicidal ideation? Denies

## 2023-01-21 NOTE — Group Note (Signed)
Date:  01/21/2023 Time:  9:16 PM  Group Topic/Focus:  Wrap-Up Group:   The focus of this group is to help patients review their daily goal of treatment and discuss progress on daily workbooks.    Participation Level:  Active  Participation Quality:  Appropriate  Affect:  Appropriate  Cognitive:  Appropriate  Insight: Appropriate  Engagement in Group:  Engaged  Modes of Intervention:  Education and Exploration  Additional Comments:  Patient attended and participated in group tonight. He reports that the nothing significant happen for him today.  Lita Mains [facility] 01/21/2023, 9:16 PM

## 2023-01-21 NOTE — ED Notes (Addendum)
Pt increasingly agitated and yelling loudly at this nurse about wanting to go home and states the hospital staff is all liars. Pt yelling that he is not crazy and was seen hitting his forehead on the wall a few times while claiming he can act crazy. Pt reports he will not be cooperative if staff is trying to make him do something that he does not want to do because he is the king of the universe and his mom told him since he is king he can do what he wants. Pt explained about being under commitment and the admission process. Pt told that he will not be discharged from this hospital and that the sheriff dept will be picking him up to take him to [facility] in [place] where he will complete his admission and then eventually discharged from there.  Pt continued to argue and yell for several minutes but then sat on bed and said he would take a PO medication to help him calm down and prefers to not get an injection.

## 2023-01-21 NOTE — ED Notes (Signed)
Hospital meal provided, pt tolerated w/o complaints.  Waste discarded appropriately.  

## 2023-01-21 NOTE — ED Notes (Signed)
Pt wife at the bedside to visit with pt. Pt awake and calm at this time.

## 2023-01-21 NOTE — ED Notes (Signed)
Patient received breakfast at this time.

## 2023-01-22 DIAGNOSIS — G4700 Insomnia, unspecified: Secondary | ICD-10-CM | POA: Diagnosis present

## 2023-01-22 DIAGNOSIS — F29 Unspecified psychosis not due to a substance or known physiological condition: Secondary | ICD-10-CM

## 2023-01-22 DIAGNOSIS — F1220 Cannabis dependence, uncomplicated: Secondary | ICD-10-CM | POA: Diagnosis present

## 2023-01-22 DIAGNOSIS — F1511 Other stimulant abuse, in remission: Secondary | ICD-10-CM | POA: Diagnosis present

## 2023-01-22 MED ORDER — HALOPERIDOL LACTATE 5 MG/ML IJ SOLN
5.0000 mg | Freq: Three times a day (TID) | INTRAMUSCULAR | Status: DC | PRN
  Administered 2023-01-23: 5 mg via INTRAMUSCULAR

## 2023-01-22 MED ORDER — DIPHENHYDRAMINE HCL 50 MG/ML IJ SOLN
50.0000 mg | Freq: Three times a day (TID) | INTRAMUSCULAR | Status: DC | PRN
  Administered 2023-01-22 – 2023-01-23 (×2): 50 mg via INTRAMUSCULAR
  Filled 2023-01-22 (×3): qty 1

## 2023-01-22 MED ORDER — HALOPERIDOL LACTATE 5 MG/ML IJ SOLN
5.0000 mg | Freq: Two times a day (BID) | INTRAMUSCULAR | Status: DC
  Administered 2023-01-22: 5 mg via INTRAMUSCULAR
  Filled 2023-01-22 (×7): qty 1

## 2023-01-22 MED ORDER — LORAZEPAM 2 MG/ML IJ SOLN
2.0000 mg | Freq: Three times a day (TID) | INTRAMUSCULAR | Status: DC | PRN
  Administered 2023-01-22 – 2023-01-23 (×2): 2 mg via INTRAMUSCULAR
  Filled 2023-01-22 (×3): qty 1

## 2023-01-22 MED ORDER — HALOPERIDOL 5 MG PO TABS
5.0000 mg | ORAL_TABLET | Freq: Three times a day (TID) | ORAL | Status: DC | PRN
  Filled 2023-01-22: qty 1

## 2023-01-22 MED ORDER — DIPHENHYDRAMINE HCL 25 MG PO CAPS
50.0000 mg | ORAL_CAPSULE | Freq: Three times a day (TID) | ORAL | Status: DC | PRN
  Administered 2023-01-23: 50 mg via ORAL
  Filled 2023-01-22: qty 2

## 2023-01-22 MED ORDER — HALOPERIDOL LACTATE 2 MG/ML PO CONC
5.0000 mg | Freq: Two times a day (BID) | ORAL | Status: DC
  Filled 2023-01-22 (×4): qty 5

## 2023-01-22 MED ORDER — HALOPERIDOL 5 MG PO TABS
5.0000 mg | ORAL_TABLET | Freq: Two times a day (BID) | ORAL | Status: DC
  Administered 2023-01-23: 5 mg via ORAL
  Filled 2023-01-22 (×7): qty 1

## 2023-01-22 MED ORDER — LORAZEPAM 1 MG PO TABS
2.0000 mg | ORAL_TABLET | Freq: Three times a day (TID) | ORAL | Status: DC | PRN
  Administered 2023-01-23: 2 mg via ORAL
  Filled 2023-01-22: qty 2

## 2023-01-22 NOTE — Progress Notes (Addendum)
After talking to NP-doris, she stated it was ok to give pt IM agitation protocol for the forced medication order. Pt was informed that he would be getting the IM medication tonight, with the help of Zaira Banker) from day shift interpreting the information. Pt came to staff stating he would take the PO medication, but pt was informed the IMs were already drawn up and he refused the PO medications earlier today and this evening. Pt accepted the IM medication without incident and was informed that moving forward if he takes the PO medication he would not have to get the shots , and pt appeared to understand the information given.

## 2023-01-22 NOTE — Group Note (Signed)
Recreation Therapy Group Note   Group Topic:Problem Solving  Group Date: 01/22/2023 Start Time: 1038 End Time: 1115 Facilitators: Shamonique Battiste-McCall, LRT,CTRS Location: [address] Dayroom   Goal Area(s) Addresses:  Patient will effectively work with peer towards shared goal.  Patient will identify skills used to make activity successful.  Patient will share challenges and verbalize solution-driven approaches used. Patient will identify how skills used during activity can be used to reach post d/c goals.   Group Description:  [employer]. Patients were provided the following materials: 2 drinking straws, 5 rubber bands, 5 paper clips, 2 index cards and 2 drinking cups. Using the provided materials patients were asked to build a launching mechanism to launch a ping pong ball across the room, approximately 10 feet. Patients were divided into teams of 3-5. Instructions required all materials be incorporated into the device, functionality of items left to the peer group's discretion.   Affect/Mood: N/A   Participation Level: Did not attend    Clinical Observations/Individualized Feedback:     Plan: Continue to engage patient in RT group sessions 2-3x/week.   Tomaz Janis-McCall, LRT,CTRS  01/22/2023 12:00 PM

## 2023-01-22 NOTE — Progress Notes (Signed)
   01/22/23 2100  Psych Admission Type (Psych Patients Only)  Admission Status Involuntary  Psychosocial Assessment  Patient Complaints Anxiety;Suspiciousness  Eye Contact Fair  Facial Expression Sad  Affect Appropriate to circumstance  Speech Logical/coherent  Interaction Cautious;Isolative  Motor Activity Slow  Appearance/Hygiene Disheveled  Aggressive Behavior  Effect No apparent injury  Thought Process  Coherency Circumstantial  Content Religiosity;Preoccupation  Delusions WDL  Perception Hallucinations  Hallucination Visual  Judgment Impaired  Confusion None  Danger to Self  Current suicidal ideation? Denies

## 2023-01-22 NOTE — Group Note (Unsigned)
Date:  01/24/2023 Time:  10:20 AM  Group Topic/Focus:  Goals Group:   The focus of this group is to help patients establish daily goals to achieve during treatment and discuss how the patient can incorporate goal setting into their daily lives to aide in recovery. Orientation:   The focus of this group is to educate the patient on the purpose and policies of crisis stabilization and provide a format to answer questions about their admission.  The group details unit policies and expectations of patients while admitted.    Participation Level:  Active  Participation Quality:  Appropriate and Attentive  Affect:  Appropriate  Cognitive:  Appropriate  Insight: Appropriate  Engagement in Group:  Engaged  Modes of Intervention:  Discussion  Additional Comments:  Patient attended goal group and was attentive the duration of the group. Patient's goal was to be more active during the day.   Cassian Torelli T Huriel Matt 01/24/2023, 10:20 AM  

## 2023-01-22 NOTE — Progress Notes (Signed)
Adult Psychoeducational Group Note  Date:  01/22/2023 Time:  11:01 AM  Group Topic/Focus:  Goals Group:   The focus of this group is to help patients establish daily goals to achieve during treatment and discuss how the patient can incorporate goal setting into their daily lives to aide in recovery. Orientation:   The focus of this group is to educate the patient on the purpose and policies of crisis stabilization and provide a format to answer questions about their admission.  The group details unit policies and expectations of patients while admitted.  Participation Level:  Minimal  Participation Quality:  Appropriate  Affect:  Appropriate  Cognitive:  Appropriate  Insight: Good  Engagement in Group:  Engaged  Modes of Intervention:  Education  Additional Comments:  Pt actively participated in [employer] 01/22/2023, 11:01 AM

## 2023-01-22 NOTE — Progress Notes (Signed)
  Reason for the Medication: The patient, without the benefit of the specific treatment measure, is incapable of participating in any available treatment plan that will give the patient a realistic opportunity of improving the patient's condition.   Consideration of Side Effects: Consideration of the side effects related to the medication plan has been given.   Rationale for Medication Administration: Patient has shown improvement in past hospitalizations with medication management.  He decompensates after discharge when he becomes medication compliant.   Patient presents psychotic and disorganized, delusional talking about killing his mother telling him "I have to marry her and sign divorce paperwork" readings of bible extensively noting it is telling him what to do pointing to adverse talking about obeying authorities, admits to related hallucinations, talking bizarre "listen to God"   -----   At the present time, patient has no capacity to understand the need for treatment, refusing to take psychotropic medication, because of poor insight and judgment.  Patient states that they have no problem, no mental illness and refuses to consider taking medication, recommended by the psychiatrist Dr. Sherron Flemings.  Diagnosis: Acute psychosis   Treatment plan/Opinion: -patient is mentally ill, as needs hospitalization -patient continues to display active symptoms of psychosis, presents disorganized and delusional with disorganized thought disorder and bizarre speech, admitting to auditory hallucinations -patient lacks the capacity to consent to treatment with medication given ongoing psychosis and poor insight and judgment -patient is unable to rationally discuss medication options, risks versus benefit due to their symptoms. -treatment with medication and medication changes would be in their best interest -psychotropics are effective treatment for symptoms of psychosis   Based on my evaluation, the  following medications are recommend and indicated for treatment of the patient's psychiatric diagnosis and symptoms: First or second generation antipsychotic p.o. or IM to help with psychotic symptoms.

## 2023-01-22 NOTE — Progress Notes (Signed)
Dar Note: Patient presents with anxious affect and irritable mood.  Patient is withdrawn and isolative to his room.  Paranoid about his environment and medication.  Stated he's not going to take any medication while in the hospital.  Stated only law enforcement can force me to take medication.  Patient observed talking to himself in the dayroom during group and in the hallway.  Routine safety checks maintained.

## 2023-01-22 NOTE — H&P (Cosign Needed Addendum)
Psychiatric Admission Assessment Adult  Patient Identification: Elijah Ryan MRN:  [id number] Date of Evaluation:  01/22/2023 Chief Complaint:  Acute psychosis (HCC) [F23] Principal Diagnosis: Schizophrenia spectrum disorder with psychotic disorder type not yet determined (HCC) Diagnosis:  Principal Problem:   Schizophrenia spectrum disorder with psychotic disorder type not yet determined (HCC) Active Problems:   Delta-9-tetrahydrocannabinol (THC) dependence (HCC)   History of methamphetamine abuse (HCC)   Insomnia This patient is primarily Spanish speaking, assessment completed with the assistance of Spanish speaking [facility] RN.  CC:" I worked up to a Emergency planning/management officer because I wanted to be repatriated to [place] come from, but they took me to [place] instead, then brought me here.  I am not crazy"  Reason For Admission: Elijah Ryan. [name] is a 37 yo Hispanic male with an unknown mental health history, who presented to the [facility] ER @  on 6/09 under IVC taken out by his family members for manic type symptoms and psychosis; As per documentation from the ER, patient was manic, pacing, seeing devils, saying that he is God, and stating that he would take over the world etc. Patient was transferred on 6/10 by law enforcement to this behavioral health Hospital for treatment and stabilization of his mental status.  Mode of transport to Hospital: [facility] Current Outpatient (Home) Medication List: Unknown, patient is a poor historian but states that he is not currently taking any medications. PRN medication prior to evaluation: Milk of magnesia, hydroxyzine, Maalox, agitation protocol medications: Haldol, Ativan, Benadryl.  ED course: Patient was agitated in the ER, repeatedly hit his head on the wall demanding to leave as per ER documentation.  Collateral Information:  Patient consented to his girlfriend being called, states that he lives with  her, but number that is listed in the chart as being his number, is the number to the sheriff's office for [facility]  Writer unable to locate number to call girlfriend or any other family members initially, but she called and provided the following number to the nursing staff: [phone]-Self identifies as [name], and states that she is patient's wife.   Patient's wife denies any mental health diagnosis inpatient, denies that patient is currently taking any mental health medications, or has ever taken any mental health medications in the past.  She reports that patient's family has told her in the past that patient had reported to his mother that he was hearing voices in the past.  Patient's wife states that they have been married for 3 years now, but he had never had any evidence of psychosis until 3 to 4 months ago, when patient began talking to himself.    Patient's wife reports that for 1 week prior to this hospitalization patient was not eating well, not sleeping well, had a lot of energy, was extremely talkative with rapid speech, and on Sunday this past week, grabbed her and stated that he loves her, and that God was waiting for them, while he was reading the Bible.  Patient's wife also reports the patient became extremely religiously preoccupied, and his use of marijuana increased.  Patient's wife states that she believes that his marijuana is being laced with something else.  She reports that patient has a history of cocaine abuse as well as crystal meth abuse.  Patient's wife reports that he most likely gets it from his friends when she is at work.  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  She reports that prior to this hospitalization, patient was also having visual hallucinations, she reports that he told her about the visual  hallucinations 1 month ago, and stated that she was seeing a lady whom he referred to as "mother".  Patient's wife reports that she took him to the magistrate's office after she inquired on what to do from her colleagues at work.  Wife reports that his psychosis got worse, and patient was telling her that God had to him that he (patient) was going to take over the world.   Patient wife reports that patient has been repatriated back to [place] times prior and return back to the [place].  She reports that it is not 9 times like patient indicated.  She states that patient is not working, but the home is paid off, and she reports that patient has been very worried about finances lately, because she is pregnant, and also has pregnancy-induced DM and HTN.  Wife asked for code number to come visit patient, and writer sought patient's consent, and he declined. Writer explained to wife to give patient some time, to see how he reponds to medications prior to her visiting, and that he is safe here at the hospital.  Patient's wife thanked Clinical research associate and call ended.  POA/Legal Guardian: Patient reports that he is his own guardian  History of present illness:  Patient is able to confirm most of the information provided by wife above.  He is however, disorganized during assessment, presents with tangentiality when answering questions, requiring frequent verbal redirections to answer questions.  He presents with psychosis AEB +AH, +VH, delusions of persecutions, thought insertions, thought withdrawals, grandiosity;  Patient reports that he has been hearing voices since March of last year, reports that the voices are multiple, and that the fifth voice is his mother's voice. He reports that the voices say random things, such as calling him "crazy", "stupid", "witch".  Patient reports that he also hears the voice of the [employer], and hears "mother", telling him that he has something waiting for him in [place],  reports that the mother he hears is his spiritual mother, and states that she is locked up somewhere and is reaching out for him.  Patient reports visual hallucinations that he has had since childhood, states that he sees fire in the sky, and states the word of God written in the sky; states that what is written that God is coming, but states it does not frighten him.  Patient states that the trees talk to him, the animals talk to him, he Midwife and RN who are completing assessment do not know him, states that he is sent by God.  When asked who he is, he states his name, followed by his parent's name, then states that we are holding him against his will here, and that he cannot state what he was sent to do here.  Patient states that he has sold his soul to the devil, and is not afraid, in response to being asked questions related to paranoia.  He talks about being able to receive special messages just for him from electronics, and that he is able to filter out what is good and what is bad in those messages.   Patient admits to manic type symptoms prior to this admission, stating that they  were ongoing for at least 7 days.  Patient states that his energy was extremely elevated prior to hospitalization, he reports insomnia, decreased appetite, doing things that are typically not him, and talks about sexually inappropriate behaviors that he was exhibiting, but states that it was last year, states that he was masturbating excessively.  He reports a lot of anxiety prior to this hospitalization, denies panic attacks, denies emotional/physical or any sexual abuse in the past, denies head trauma, denies self-injurious behaviors in the past or most recently.  He denies any prior mental health diagnosis  Patient perseverates about wanting to go back to [place]  He talks about going to the police officers, because he wanted to be sent back to his country.  He states that he has been repatriated 9 times,  and has managed to come back if she time via trains.  He reports that he has no job here in the [place], and no means to support his family, has no money, has a baby on the way, and has other children that he needs to take care of.  He states that he has 2 children ages 36 and 109 years old here in the [place], has 2 in [place] ages 69 and 74.  And he also has 1 on the way with his current wife.  He talks about it being shameful for the [place] to send him back with nothing.  Past Psychiatric Hx: Previous Psych Diagnoses: Denies  Prior inpatient treatment: Denies  Current/prior outpatient treatment: Denies  Prior rehab hx: Denies  Psychotherapy hx: Denies  History of suicide attempts: Denies  History of homicide or aggression:  Denies  Psychiatric medication history: Never taken psychotropic medications Psychiatric medication compliance history: N/A Neuromodulation history: N/A Current Psychiatrist: Denies  Current therapist:  Denies   Substance Abuse Hx: Alcohol: 6 pack beer on the weekends, last drink was 4 days ago, and patient reports that he drank 1 beer. Tobacco: 5 cigarettes daily, declines patch or gum. Illicit drugs: Marijuana daily, smokes at least 1 g daily to help with relaxation and anxiety.  History of crystal meth, states last use was 4 months ago.  As per patient's wife, patient has a history of cocaine abuse, patient reports that his last use of cocaine was in December 2023, and he rarely uses it because it causes him insomnia.  Rx drug abuse: Denies Rehab hx: Denies  Past Medical History: Medical Diagnoses: Denies Home Rx: Denies  Prior Hosp: Denies  Prior Surgeries/Trauma: Accidentally fell off the roof and 2019, injuring left hand, scar visible on this hand, patient reports that he had a surgery done on the hand Head trauma, LOC, concussions, seizures: Denies Allergies: Denies LMP: N/A Contraception:  Denies  PCP: Denies  Family History: Medical: Denies   Psych: Denies  Psych Rx: Denies  SA/HA: Denies  Substance use family hx: Denies   Social History: Patient reports that he lives with his girlfriend who identifies of his wife during obtaining of collateral information.  He reports that he has 4 children as listed above under HPI, and 1 on the way.  He reports that he has a brother here in the [place], and an uncle, but they are not caring for him, he reports that the rest of his family is in [place] where he was born and raised.  He reports 5th grade education.  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  Abuse: Denies Marital Status: Lives with girlfriend, who self identifies as wife Sexual orientation: Heterosexual Children: As above Employment: None Peer Group: None Housing: With wife Finances: A stressor currently Legal: Reports being detained for 6 months last time that he was arrested for immigration charges.  Wife reports that he is wanted in [place] and if he goes back there he will get killed or jailed.  She does not state why. Military: Denies  Current presentation: Pt with an angry and irritable mood, affect is congruent. His attention to personal hygiene and grooming is poor, eye contact is good, speech is clear, but lacking in coherency. Thought contents are disorganized and illogical, and pt currently denies SI/HI. He presents with delusional thoughts, +AVH as noted above.  Associated Signs/Symptoms: Depression Symptoms:  insomnia, difficulty concentrating, anxiety, disturbed sleep, (Hypo) Manic Symptoms:  Delusions, Distractibility, Elevated Mood, Flight of Ideas, Grandiosity, Hallucinations, Impulsivity, Irritable Mood, Labiality of Mood, Sexually Inapproprite Behavior, Anxiety Symptoms:  Excessive Worry, Psychotic Symptoms:  Delusions, Hallucinations: Auditory Visual Ideas of Reference, Paranoia, PTSD Symptoms: NA Total Time spent with patient: 1.5  hours  Past Psychiatric History: N/a  Is the patient at risk to self? Yes.    Has the patient been a risk to self in the past 6 months? Yes.    Has the patient been a risk to self within the distant past? Yes.    Is the patient a risk to others? Yes.    Has the patient been a risk to others in the past 6 months? Yes.    Has the patient been a risk to others within the distant past? No.   [place] Scale:  Flowsheet Row [facility]) [facility]  C-SSRS RISK CATEGORY High Risk No Risk       Alcohol Screening: Patient refused Alcohol Screening Tool:  (No) 1. How often do you have a drink containing alcohol?: Never 2. How many drinks containing alcohol do you have on a typical day when you are drinking?: 1 or 2 3. How often do you have six or more drinks on one occasion?: Never AUDIT-C Score: 0 Alcohol Brief Interventions/Follow-up: Alcohol education/Brief advice Substance Abuse History in the last 12 months:  Yes.   Consequences of Substance Abuse: Medical Consequences:  worsening of mental health , Previous Psychotropic Medications: No  Psychological Evaluations: No  Past Medical History: History reviewed. No pertinent past medical history. History reviewed. No pertinent surgical history. Family History: History reviewed. No pertinent family history. Family Psychiatric  History: Denies  Tobacco Screening:  Social History   Tobacco Use  Smoking Status Never  Smokeless Tobacco Never    BH Tobacco Counseling     Are you interested in Tobacco Cessation Medications?  No value filed. Counseled patient on smoking cessation:  Yes Reason Tobacco Screening Not Completed: No value filed.       Social History:  Social History   Substance and Sexual Activity  Alcohol Use Not Currently     Social History   Substance and Sexual Activity  Drug Use  Not Currently   Allergies:  No Known Allergies Lab Results:  Results for orders placed or performed during the hospital encounter of 01/20/23 (from the past 48 hour(s))  Comprehensive metabolic panel     Status: Abnormal   Collection Time: 01/20/23  8:58 PM  Result Value Ref Range   Sodium 137 135 - 145  mmol/L   Potassium 3.5 3.5 - 5.1 mmol/L   Chloride 103 98 - 111 mmol/L   CO2 22 22 - 32 mmol/L   Glucose, Bld 120 (H) 70 - 99 mg/dL    Comment: Glucose reference range applies only to samples taken after fasting for at least 8 hours.   BUN 25 (H) 6 - 20 mg/dL   Creatinine, Ser 2.95 0.61 - 1.24 mg/dL   Calcium 9.2 8.9 - 62.1 mg/dL   Total Protein 8.1 6.5 - 8.1 g/dL   Albumin 4.5 3.5 - 5.0 g/dL   AST 23 15 - 41 U/L   ALT 27 0 - 44 U/L   Alkaline Phosphatase 88 38 - 126 U/L   Total Bilirubin 0.5 0.3 - 1.2 mg/dL   GFR, Estimated >30 >86 mL/min    Comment: (NOTE) Calculated using the CKD-EPI Creatinine Equation (2021)    Anion gap 12 5 - 15    Comment: Performed at [facility], [address], [place], [place] [address]  Ethanol     Status: None   Collection Time: 01/20/23  8:58 PM  Result Value Ref Range   Alcohol, Ethyl (B) <10 <10 mg/dL    Comment: (NOTE) Lowest detectable limit for serum alcohol is 10 mg/dL.  For medical purposes only. Performed at [facility], [address] [place], [place] [address]   Salicylate level     Status: Abnormal   Collection Time: 01/20/23  8:58 PM  Result Value Ref Range   Salicylate Lvl <7.0 (L) 7.0 - 30.0 mg/dL    Comment: Performed at [facility], [address] [place], [place] [address]  Acetaminophen level     Status: Abnormal   Collection Time: 01/20/23  8:58 PM  Result Value Ref Range   Acetaminophen (Tylenol), Serum <10 (L) 10 - 30 ug/mL    Comment: (NOTE) Therapeutic concentrations vary significantly. A range of 10-30 ug/mL  may be an effective concentration for many patients. However, some   are best treated at concentrations outside of this range. Acetaminophen concentrations >150 ug/mL at 4 hours after ingestion  and >50 ug/mL at 12 hours after ingestion are often associated with  toxic reactions.  Performed at [facility], [address] [place], [place] [address]   cbc     Status: Abnormal   Collection Time: 01/20/23  8:58 PM  Result Value Ref Range   WBC 14.1 (H) 4.0 - 10.5 K/uL   RBC 4.79 4.22 - 5.81 MIL/uL   Hemoglobin 13.7 13.0 - 17.0 g/dL   HCT 02.7 25.3 - 66.4 %   MCV 86.2 80.0 - 100.0 fL   MCH 28.6 26.0 - 34.0 pg   MCHC 33.2 30.0 - 36.0 g/dL   RDW 40.3 47.4 - 25.9 %   Platelets 367 150 - 400 K/uL   nRBC 0.0 0.0 - 0.2 %    Comment: Performed at [facility], [address] [place], [place] [address]  Urine Drug Screen, Qualitative     Status: Abnormal   Collection Time: 01/20/23 10:12 PM  Result Value Ref Range   Tricyclic, Ur Screen NONE DETECTED NONE DETECTED   Amphetamines, Ur Screen NONE DETECTED NONE DETECTED   MDMA (Ecstasy)Ur Screen NONE DETECTED NONE DETECTED   Cocaine Metabolite,Ur [place] NONE DETECTED NONE DETECTED   Opiate, Ur Screen NONE DETECTED NONE DETECTED   Phencyclidine (PCP) Ur S NONE DETECTED NONE DETECTED   Cannabinoid 50 Ng, Ur [place] POSITIVE (A) NONE DETECTED  Barbiturates, Ur Screen NONE DETECTED NONE DETECTED   Benzodiazepine, Ur Scrn NONE DETECTED NONE DETECTED   Methadone Scn, Ur NONE DETECTED NONE DETECTED    Comment: (NOTE) Tricyclics + metabolites, urine    Cutoff 1000 ng/mL Amphetamines + metabolites, urine  Cutoff 1000 ng/mL MDMA (Ecstasy), urine              Cutoff 500 ng/mL Cocaine Metabolite, urine          Cutoff 300 ng/mL Opiate + metabolites, urine        Cutoff 300 ng/mL Phencyclidine (PCP), urine         Cutoff 25 ng/mL Cannabinoid, urine                 Cutoff 50 ng/mL Barbiturates + metabolites, urine  Cutoff 200 ng/mL Benzodiazepine, urine              Cutoff 200 ng/mL Methadone, urine                    Cutoff 300 ng/mL  The urine drug screen provides only a preliminary, unconfirmed analytical test result and should not be used for non-medical purposes. Clinical consideration and professional judgment should be applied to any positive drug screen result due to possible interfering substances. A more specific alternate chemical method must be used in order to obtain a confirmed analytical result. Gas chromatography / mass spectrometry (GC/MS) is the preferred confirm atory method. Performed at [facility], [address] [place], [place] [address]     Blood Alcohol level:  Lab Results  Component Value Date   [facility] <10 01/20/2023    Metabolic Disorder Labs:  No results found for: "HGBA1C", "MPG" No results found for: "PROLACTIN" No results found for: "CHOL", "TRIG", "HDL", "CHOLHDL", "VLDL", "LDLCALC"  Current Medications: Current Facility-Administered Medications  Medication Dose Route Frequency Provider Last Rate Last Admin   acetaminophen (TYLENOL) tablet 650 mg  650 mg Oral Q6H PRN Dixon, Rashaun M, NP       alum & mag hydroxide-simeth (MAALOX/MYLANTA) 200-200-20 MG/5ML suspension 30 mL  30 mL Oral Q4H PRN Dixon, Rashaun M, NP       haloperidol (HALDOL) tablet 5 mg  5 mg Oral TID PRN Phineas Inches, MD       And   LORazepam (ATIVAN) tablet 2 mg  2 mg Oral TID PRN Phineas Inches, MD       And   diphenhydrAMINE (BENADRYL) capsule 50 mg  50 mg Oral TID PRN Massengill, Harrold Donath, MD       haloperidol lactate (HALDOL) injection 5 mg  5 mg Intramuscular TID PRN Massengill, Harrold Donath, MD       And   LORazepam (ATIVAN) injection 2 mg  2 mg Intramuscular TID PRN Massengill, Harrold Donath, MD       And   diphenhydrAMINE (BENADRYL) injection 50 mg  50 mg Intramuscular TID PRN Massengill, Harrold Donath, MD       feeding supplement (ENSURE ENLIVE / ENSURE PLUS) liquid 237 mL  237 mL Oral BID BM Massengill, Harrold Donath, MD   237 mL at 01/22/23 0906   haloperidol (HALDOL) tablet  5 mg  5 mg Oral Q12H Massengill, Nathan, MD       Or   haloperidol lactate (HALDOL) injection 5 mg  5 mg Intramuscular Q12H Massengill, Nathan, MD       hydrOXYzine (ATARAX) tablet 25 mg  25 mg Oral TID PRN Jearld Lesch, NP       magnesium  hydroxide (MILK OF MAGNESIA) suspension 30 mL  30 mL Oral Daily PRN Jearld Lesch, NP       traZODone (DESYREL) tablet 50 mg  50 mg Oral QHS PRN Jearld Lesch, NP       PTA Medications: No medications prior to admission.   Musculoskeletal: Strength & Muscle Tone: within normal limits Gait & Station: normal Patient leans: N/A  Psychiatric Specialty Exam:  Presentation  General Appearance: Disheveled  Eye Contact:Fair  Speech:Pressured  Speech Volume:Increased  Handedness:Right   Mood and Affect  Mood:Anxious; Irritable; Angry  Affect:Congruent   Thought Process  Thought Processes:Disorganized  Duration of Psychotic Symptoms: >2 weeks  Past Diagnosis of Schizophrenia or Psychoactive disorder: No  Descriptions of Associations:Tangential  Orientation:Partial  Thought Content:Illogical  Hallucinations:Hallucinations: Auditory; Visual Description of Auditory Hallucinations: God's voice Description of Visual Hallucinations: Angels and Jesus  Ideas of Reference:Delusions; Paranoia; Percusatory  Suicidal Thoughts:Suicidal Thoughts: No  Homicidal Thoughts:Homicidal Thoughts: No   Sensorium  Memory:Immediate Good  Judgment:Poor  Insight:Poor   Executive Functions  Concentration:Poor  Attention Span:Poor  Recall:Poor  Fund of Knowledge:Poor  Language:Poor   Psychomotor Activity  Psychomotor Activity:Psychomotor Activity: Normal   Assets  Assets:Resilience; Social Support   Sleep  Sleep:Sleep: Poor    Physical Exam: Physical Exam Constitutional:      Appearance: Normal appearance.  Musculoskeletal:        General: Normal range of motion.     Cervical back: Normal range of motion.   Neurological:     General: No focal deficit present.     Mental Status: He is alert.    Review of Systems  Constitutional:  Negative for fever.  HENT:  Negative for hearing loss.   Eyes:  Negative for blurred vision.  Respiratory:  Negative for cough.   Cardiovascular:  Negative for chest pain.  Gastrointestinal:  Negative for heartburn.  Genitourinary:  Negative for dysuria.  Musculoskeletal:  Negative for myalgias.  Skin:  Negative for rash.  Neurological:  Negative for dizziness.  Psychiatric/Behavioral:  Positive for depression, hallucinations and substance abuse. Negative for memory loss and suicidal ideas. The patient is nervous/anxious and has insomnia.    Blood pressure 109/74, pulse 73, temperature 99.3 F (37.4 C), temperature source Oral, resp. rate 18, height 5' 6.5" (1.689 m), weight 61.4 kg, SpO2 100 %. Body mass index is 21.53 kg/m.  Treatment Plan Summary: Daily contact with patient to assess and evaluate symptoms and progress in treatment and Medication management  Safety and Monitoring: Voluntary admission to inpatient psychiatric unit for safety, stabilization and treatment Daily contact with patient to assess and evaluate symptoms and progress in treatment Patient's case to be discussed in multi-disciplinary team meeting Observation Level : q15 minute checks Vital signs: q12 hours Precautions: Safety  Long Term Goal(s): Improvement in symptoms so as ready for discharge  Short Term Goals: Ability to identify changes in lifestyle to reduce recurrence of condition will improve, Ability to verbalize feelings will improve, Ability to disclose and discuss suicidal ideas, Ability to demonstrate self-control will improve, Ability to identify and develop effective coping behaviors will improve, Ability to maintain clinical measurements within normal limits will improve, Compliance with prescribed medications will improve, and Ability to identify triggers associated  with substance abuse/mental health issues will improve  Diagnoses Principal Problem:   Schizophrenia spectrum disorder with psychotic disorder type not yet determined (HCC) Active Problems:   Delta-9-tetrahydrocannabinol (THC) dependence (HCC)   History of methamphetamine abuse (HCC)   Insomnia  Medications-FORCED MED  ORDER IN PLACE- -Start Haldol 5 mg twice daily for psychosis. Forced medication order in place, MUST give IM Haldol 5 mg if patient refuses Haldol 5 mg PO.   PRNS -Start hydroxyzine 25 mg as needed 3 times daily for anxiety -Start trazodone 50 mg nightly as needed for sleep -Start agitation protocol medications: Benadryl 50 mg/Haldol 5 mg/Ativan 2 mg p.o. or IM 3 times daily for agitation as needed -Continue Tylenol 650 mg every 6 hours PRN for mild pain -Continue Maalox 30 mg every 4 hrs PRN for indigestion -Continue Milk of Magnesia as needed every 6 hrs for constipation  Labs reviewed: Orders placed for lipid panel, TSH, hemoglobin A1c, vitamin D, repeat CBC due to elevated WBCs of 14, EKG with QTc within normal limits.  ANTIPSYCHOTIC CONSENT : We discussed the risks, benefits, side effects, and alternatives to THE PRESCRIBED ANTIPSYCHOTIC, including but not limited to, the risk of fatigue, sedation, metabolic syndrome, weight gain, movement abnormalities such as tremor & cogwheeling & tardive dyskinesia, temperature sensitivity, photosensitivity, blood pressure changes, heart rhythm effects, potential for medication interactions, and to not take these medications with alcohol or illicit drugs; informed consent was obtained. We discussed the necessity for routine monitoring including rating scales of abnormal movements, blood work, and ekgs, while the patient is prescribed antipsychotic medication.   Patient will make reeducation of information above as psychosis clears, as he currently did not comprehend this information in current mental status.  Discharge  Planning: Social work and case management to assist with discharge planning and identification of hospital follow-up needs prior to discharge Estimated LOS: 5-7 days Discharge Concerns: Need to establish a safety plan; Medication compliance and effectiveness Discharge Goals: Return home with outpatient referrals for mental health follow-up including medication management/psychotherapy  I certify that inpatient services furnished can reasonably be expected to improve the patient's condition.    Starleen Blue, NP 6/11/20248:18 PM

## 2023-01-22 NOTE — BHH Suicide Risk Assessment (Signed)
Suicide Risk Assessment  Admission Assessment    [facility] Admission Suicide Risk Assessment   Nursing information obtained from:  Patient Demographic factors:  Male Current Mental Status:  NA Loss Factors:  NA Historical Factors:  NA Risk Reduction Factors:  Living with another person, especially a relative, Responsible for children under 35 years of age  Total Time spent with patient: 1.5 hours Principal Problem: Schizophrenia spectrum disorder with psychotic disorder type not yet determined (HCC) Diagnosis:  Principal Problem:   Schizophrenia spectrum disorder with psychotic disorder type not yet determined (HCC) Active Problems:   Delta-9-tetrahydrocannabinol (THC) dependence (HCC)   History of methamphetamine abuse (HCC)   Insomnia  Subjective Data: " I worked up to a Emergency planning/management officer because I wanted to be repatriated to [place] I come from, but they took me to [place] instead, then brought me here.  I am not crazy"   Continued Clinical Symptoms: Psychosis, irritable and angry mood requiring continuous hospitalization for treatment and stabilization.   The "Alcohol Use Disorders Identification Test", Guidelines for Use in Primary Care, Second Edition.  World Science writer [facility]). Score between 0-7:  no or low risk or alcohol related problems. Score between 8-15:  moderate risk of alcohol related problems. Score between 16-19:  high risk of alcohol related problems. Score 20 or above:  warrants further diagnostic evaluation for alcohol dependence and treatment.  CLINICAL FACTORS:   Currently Psychotic  Musculoskeletal: Strength & Muscle Tone: within normal limits Gait & Station: normal Patient leans: N/A  Psychiatric Specialty Exam:  Presentation  General Appearance:  Disheveled  Eye Contact: Fair  Speech: Pressured  Speech Volume: Increased  Handedness: Right   Mood and Affect  Mood: Anxious; Irritable;  Angry  Affect: Congruent   Thought Process  Thought Processes: Disorganized  Descriptions of Associations:Tangential  Orientation:Partial  Thought Content:Illogical  History of Schizophrenia/Schizoaffective disorder:No  Duration of Psychotic Symptoms:Less than six months  Hallucinations:Hallucinations: Auditory; Visual Description of Auditory Hallucinations: God's voice Description of Visual Hallucinations: Angels and Jesus  Ideas of Reference:Delusions; Paranoia; Percusatory  Suicidal Thoughts:Suicidal Thoughts: No  Homicidal Thoughts:Homicidal Thoughts: No   Sensorium  Memory: Immediate Good  Judgment: Poor  Insight: Poor   Executive Functions  Concentration: Poor  Attention Span: Poor  Recall: Poor  Fund of Knowledge: Poor  Language: Poor   Psychomotor Activity  Psychomotor Activity: Psychomotor Activity: Normal   Assets  Assets: Resilience; Social Support   Sleep  Sleep: Sleep: Poor   Physical Exam: Physical Exam Review of Systems  Psychiatric/Behavioral:  Positive for depression, hallucinations and substance abuse. Negative for memory loss and suicidal ideas. The patient is nervous/anxious and has insomnia.    Blood pressure 109/74, pulse 73, temperature 99.3 F (37.4 C), temperature source Oral, resp. rate 18, height 5' 6.5" (1.689 m), weight 61.4 kg, SpO2 100 %. Body mass index is 21.53 kg/m.   COGNITIVE FEATURES THAT CONTRIBUTE TO RISK:  None    SUICIDE RISK:   Mild:  There are no identifiable suicide plans, no associated intent, mild dysphoria and related symptoms, good self-control (both objective and subjective assessment), few other risk factors, and identifiable protective factors, including available and accessible social support.   PLAN OF CARE: See H & P  I certify that inpatient services furnished can reasonably be expected to improve the patient's condition.   Starleen Blue, NP 01/22/2023, 7:03 PM

## 2023-01-22 NOTE — Progress Notes (Signed)
Pt informed that he has a forced medication order, medication was not given tonight per doctor wanting Interpretor there when forced medication given. Pt stated we were going to have to hold him down and give him the medication, because he was going to bite whoever comes at him. Staff tried to explain the process of forced medication order, but pt continued to say he was not going to take medication.

## 2023-01-22 NOTE — Progress Notes (Addendum)
Per MHT, Patient was being disruptive in cafeteria "yelling in spanish."   When arriving back into unit, patient visible in hallway stating "I want to go." "Just send me back to [place] get me the ticket." Patient continues to refuse any medication stating "you will have to tie me down to medicate me" "I don't need anything, I am not sick and feel fine." Patient continues to speak about the bible and his "spiritual mother" who speaks to him telling him to go to [place]. Patient states "Im not a [place] citizen so you cannot do this to me." "Send me back." No aggressive behavior noted and patient able to be redirected and verbally de-escalated.

## 2023-01-23 ENCOUNTER — Encounter (HOSPITAL_COMMUNITY): Payer: Self-pay

## 2023-01-23 DIAGNOSIS — F29 Unspecified psychosis not due to a substance or known physiological condition: Secondary | ICD-10-CM | POA: Diagnosis not present

## 2023-01-23 LAB — CBC
HCT: 46.8 % (ref 39.0–52.0)
Hemoglobin: 14.6 g/dL (ref 13.0–17.0)
MCH: 28.5 pg (ref 26.0–34.0)
MCHC: 31.2 g/dL (ref 30.0–36.0)
MCV: 91.4 fL (ref 80.0–100.0)
Platelets: 313 10*3/uL (ref 150–400)
RBC: 5.12 MIL/uL (ref 4.22–5.81)
RDW: 13.4 % (ref 11.5–15.5)
WBC: 10.6 10*3/uL — ABNORMAL HIGH (ref 4.0–10.5)
nRBC: 0 % (ref 0.0–0.2)

## 2023-01-23 LAB — TSH: TSH: 2.801 u[IU]/mL (ref 0.350–4.500)

## 2023-01-23 LAB — LIPID PANEL
Cholesterol: 221 mg/dL — ABNORMAL HIGH (ref 0–200)
HDL: 38 mg/dL — ABNORMAL LOW (ref >40)
LDL Cholesterol: 147 mg/dL — ABNORMAL HIGH (ref 0–99)
Total CHOL/HDL Ratio: 5.8 RATIO
Triglycerides: 178 mg/dL — ABNORMAL HIGH (ref <150)
VLDL: 36 mg/dL (ref 0–40)

## 2023-01-23 LAB — VITAMIN D 25 HYDROXY (VIT D DEFICIENCY, FRACTURES): Vit D, 25-Hydroxy: 20.33 ng/mL — ABNORMAL LOW (ref 30–100)

## 2023-01-23 LAB — HEMOGLOBIN A1C
Hgb A1c MFr Bld: 5.6 % (ref 4.8–5.6)
Mean Plasma Glucose: 114.02 mg/dL

## 2023-01-23 MED ORDER — HALOPERIDOL LACTATE 5 MG/ML IJ SOLN
5.0000 mg | Freq: Two times a day (BID) | INTRAMUSCULAR | Status: DC
  Filled 2023-01-23 (×8): qty 1

## 2023-01-23 MED ORDER — BENZTROPINE MESYLATE 0.5 MG PO TABS
0.5000 mg | ORAL_TABLET | Freq: Two times a day (BID) | ORAL | Status: DC
  Administered 2023-01-24 – 2023-01-29 (×11): 0.5 mg via ORAL
  Filled 2023-01-23 (×15): qty 1

## 2023-01-23 MED ORDER — HALOPERIDOL 5 MG PO TABS
10.0000 mg | ORAL_TABLET | Freq: Two times a day (BID) | ORAL | Status: DC
  Administered 2023-01-24: 10 mg via ORAL
  Filled 2023-01-23 (×6): qty 2

## 2023-01-23 NOTE — Group Note (Signed)
Recreation Therapy Group Note   Group Topic:Self-Esteem  Group Date: 01/23/2023 Start Time: 1005 End Time: 1050 Facilitators: Eren Ryser-McCall, LRT,CTRS Location: 500 Hall Dayroom   Goal Area(s) Addresses:  Patient will successfully identify positive attributes about themselves.   Patient will successfully identify how self esteem in important.     Group Description: Patient asked to create a personalized collage on their construction paper color of choice. Patients were told to make the collage relative to self esteem; any thing that highlights positive things about them is allowed to be on the paper. Patients were given multiple craft materials to complete this activity. Patients were told to hang the collage in their room to help them acknowledge their positives when they are not feeling positive about themselves.   Affect/Mood: N/A   Participation Level: Did not attend    Clinical Observations/Individualized Feedback:     Plan: Continue to engage patient in RT group sessions 2-3x/week.   Addalynne Golding-McCall, LRT,CTRS 01/23/2023 12:46 PM

## 2023-01-23 NOTE — Progress Notes (Signed)
   01/23/23 0800  Psych Admission Type (Psych Patients Only)  Admission Status Involuntary  Psychosocial Assessment  Patient Complaints Anxiety;Suspiciousness;Tension  Eye Contact Intense  Facial Expression Sad  Affect Apprehensive;Depressed;Irritable  Speech Tangential  Interaction Cautious;Isolative  Motor Activity Slow  Appearance/Hygiene In scrubs  Behavior Characteristics Guarded  Mood Preoccupied  Aggressive Behavior  Effect No apparent injury  Thought Process  Coherency Circumstantial  Content Religiosity;Preoccupation  Delusions WDL  Perception Hallucinations  Hallucination Visual  Judgment Impaired  Confusion None  Danger to Self  Current suicidal ideation? Denies  Danger to Others  Danger to Others None reported or observed

## 2023-01-23 NOTE — Group Note (Signed)
Date:  01/23/2023 Time:  8:47 PM  Group Topic/Focus:  Wrap-Up Group:   The focus of this group is to help patients review their daily goal of treatment and discuss progress on daily workbooks.    Participation Level:  Did Not Attend   Scot Dock 01/23/2023, 8:47 PM

## 2023-01-23 NOTE — BHH Counselor (Signed)
Adult Comprehensive Assessment  Patient ID: Elijah Ryan, male   DOB: 02-28-1986, 37 y.o.   MRN: [id number]  Information Source: Information source: Interpreter  Current Stressors:  Patient states their primary concerns and needs for treatment are:: patient states that he is poor and needs a different living situation Patient states their goals for this hospitilization and ongoing recovery are:: patient would like to go back to [place] Educational / Learning stressors: patient states he never went to school because he was poor and they could not send him Employment / Job issues: patient states he is currently unemployed.  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 [place] Family Relationships: patient states that he has good relationships with family but does not contact them because he has nothing to give them Financial / Lack of resources (include bankruptcy): no income Housing / Lack of housing: patient lives with girlfriend in [place] Physical health (include injuries & life threatening diseases): no issues Social relationships: girlfriend of one year Substance abuse: none reported Bereavement / Loss: lost his father a while ago  Living/Environment/Situation:  Living Arrangements: Spouse/significant other Living conditions (as described by patient or guardian): patient states that they are not good financially and have a hard time affording Who else lives in the home?: girlfirend and girlfriend child How long has patient lived in current situation?: 1 year What is atmosphere in current home: Temporary  Family History:  Marital status: Long term relationship Long term relationship, how long?: 1 year What types of issues is patient dealing with in the relationship?: patient states that the living condition is not ideal Are you sexually active?: Yes What is your sexual orientation?: heterosexual Does patient have children?: Yes How many children?:  5 How is patient's relationship with their children?: 1 grown child and 3 with various mothers, 1 on the way  Childhood History:  By whom was/is the patient raised?: Both parents Additional childhood history information: patient states that his childhood was very free Description of patient's relationship with caregiver when they were a child: good Patient's description of current relationship with people who raised him/her: good How were you disciplined when you got in trouble as a child/adolescent?: I was not disciplined Does patient have siblings?: Yes Number of Siblings: 14 Description of patient's current relationship with siblings: none Did patient suffer any verbal/emotional/physical/sexual abuse as a child?: No Did patient suffer from severe childhood neglect?: No Has patient ever been sexually abused/assaulted/raped as an adolescent or adult?: No Was the patient ever a victim of a crime or a disaster?: No Witnessed domestic violence?: No Has patient been affected by domestic violence as an adult?: No  Education:  Highest grade of school patient has completed: patient did not go to school Currently a student?: No  Employment/Work Situation:   Employment Situation: Unemployed Patient's Job has Been Impacted by Current Illness: No What is the Longest Time Patient has Held a Job?: 3 years 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?: No  Financial Resources:   Financial resources: Income from spouse, No income Does patient have a representative payee or guardian?: No  Alcohol/Substance Abuse:   What has been your use of drugs/alcohol within the last 12 months?: None If attempted suicide, did drugs/alcohol play a role in this?: No Alcohol/Substance Abuse Treatment Hx: Denies past history Has alcohol/substance abuse ever caused legal problems?: No  Social Support System:   [employer]  Support System: Poor Describe  Community Support System: girlfriend  Leisure/Recreation:   Do You Have Hobbies?: Yes Leisure and Hobbies: "I enjoy lots of things"  Strengths/Needs:   What is the patient's perception of their strengths?: "I am liked by people" Patient states these barriers may affect/interfere with their treatment: none Patient states these barriers may affect their return to the community: none Other important information patient would like considered in planning for their treatment: patient would like to return to [place]  Discharge Plan:   Currently receiving community mental health services: No Does patient have access to transportation?: Yes Does patient have financial barriers related to discharge medications?: Yes Patient description of barriers related to discharge medications: no insurance Will patient be returning to same living situation after discharge?: Yes  Summary/Recommendations:   Summary and Recommendations (to be completed by the evaluator): Nazair is a 37 year old male who was admitted to [facility] for disorganization and aggressive behavior.  Patient states that he would like to go to [place]  Patient states that he is unemployed and has no income.  He lives with pregnant girlfriend and they are poor.  Patient states that he would like to move back to [place]  He is not interested in follow up.  Patient is not connected to any outside providers. While here, [place] can benefit from crisis stabilization, medication management, therapeutic milieu, and referrals for services.   Lakeena Downie E Nadiyah Zeis. 01/23/2023

## 2023-01-23 NOTE — Progress Notes (Addendum)
Pt given PO medications, pt pt walked away from the window without allowing writer to check his mouth, Clinical research associate tried to check mouth and pt opened his mouth and Clinical research associate asked him to lift up his tongue, pt walked away and would not show, pt spit the medications out on the floor.

## 2023-01-23 NOTE — BH IP Treatment Plan (Signed)
Interdisciplinary Treatment and Diagnostic Plan Update  01/23/2023 Time of Session: 11:25 AM  [facility] Ester Linneman MRN: [id number]  Principal Diagnosis: Schizophrenia spectrum disorder with psychotic disorder type not yet determined [facility])  Secondary Diagnoses: Principal Problem:   Schizophrenia spectrum disorder with psychotic disorder type not yet determined (HCC) Active Problems:   Delta-9-tetrahydrocannabinol (THC) dependence (HCC)   History of methamphetamine abuse (HCC)   Insomnia   Current Medications:  Current Facility-Administered Medications  Medication Dose Route Frequency Provider Last Rate Last Admin   acetaminophen (TYLENOL) tablet 650 mg  650 mg Oral Q6H PRN Dixon, Rashaun M, NP       alum & mag hydroxide-simeth (MAALOX/MYLANTA) 200-200-20 MG/5ML suspension 30 mL  30 mL Oral Q4H PRN Dixon, Rashaun M, NP       haloperidol (HALDOL) tablet 5 mg  5 mg Oral TID PRN Massengill, Harrold Donath, MD       And   LORazepam (ATIVAN) tablet 2 mg  2 mg Oral TID PRN Phineas Inches, MD   2 mg at 01/23/23 4098   And   diphenhydrAMINE (BENADRYL) capsule 50 mg  50 mg Oral TID PRN Phineas Inches, MD   50 mg at 01/23/23 1191   haloperidol lactate (HALDOL) injection 5 mg  5 mg Intramuscular TID PRN Massengill, Harrold Donath, MD       And   LORazepam (ATIVAN) injection 2 mg  2 mg Intramuscular TID PRN Phineas Inches, MD   2 mg at 01/22/23 2015   And   diphenhydrAMINE (BENADRYL) injection 50 mg  50 mg Intramuscular TID PRN Phineas Inches, MD   50 mg at 01/22/23 2017   feeding supplement (ENSURE ENLIVE / ENSURE PLUS) liquid 237 mL  237 mL Oral BID BM Massengill, Harrold Donath, MD   237 mL at 01/22/23 0906   haloperidol (HALDOL) tablet 5 mg  5 mg Oral Q12H Massengill, Harrold Donath, MD   5 mg at 01/23/23 4782   Or   haloperidol lactate (HALDOL) injection 5 mg  5 mg Intramuscular Q12H Massengill, Harrold Donath, MD   5 mg at 01/22/23 2016   hydrOXYzine (ATARAX) tablet 25 mg  25 mg Oral TID PRN Jearld Lesch,  NP       magnesium hydroxide (MILK OF MAGNESIA) suspension 30 mL  30 mL Oral Daily PRN Jearld Lesch, NP       traZODone (DESYREL) tablet 50 mg  50 mg Oral QHS PRN Jearld Lesch, NP       PTA Medications: No medications prior to admission.    Patient Stressors: Medication change or noncompliance    Patient Strengths: Manufacturing systems engineer  Supportive family/friends   Treatment Modalities: Medication Management, Group therapy, Case management,  1 to 1 session with clinician, Psychoeducation, Recreational therapy.   Physician Treatment Plan for Primary Diagnosis: Schizophrenia spectrum disorder with psychotic disorder type not yet determined (HCC) Long Term Goal(s): Improvement in symptoms so as ready for discharge   Short Term Goals: Ability to identify changes in lifestyle to reduce recurrence of condition will improve Ability to verbalize feelings will improve Ability to disclose and discuss suicidal ideas Ability to demonstrate self-control will improve Ability to identify and develop effective coping behaviors will improve Ability to maintain clinical measurements within normal limits will improve Compliance with prescribed medications will improve Ability to identify triggers associated with substance abuse/mental health issues will improve  Medication Management: Evaluate patient's response, side effects, and tolerance of medication regimen.  Therapeutic Interventions: 1 to 1 sessions, Unit Group sessions and  Medication administration.  Evaluation of Outcomes: Not Progressing  Physician Treatment Plan for Secondary Diagnosis: Principal Problem:   Schizophrenia spectrum disorder with psychotic disorder type not yet determined (HCC) Active Problems:   Delta-9-tetrahydrocannabinol (THC) dependence (HCC)   History of methamphetamine abuse (HCC)   Insomnia  Long Term Goal(s): Improvement in symptoms so as ready for discharge   Short Term Goals: Ability to identify  changes in lifestyle to reduce recurrence of condition will improve Ability to verbalize feelings will improve Ability to disclose and discuss suicidal ideas Ability to demonstrate self-control will improve Ability to identify and develop effective coping behaviors will improve Ability to maintain clinical measurements within normal limits will improve Compliance with prescribed medications will improve Ability to identify triggers associated with substance abuse/mental health issues will improve     Medication Management: Evaluate patient's response, side effects, and tolerance of medication regimen.  Therapeutic Interventions: 1 to 1 sessions, Unit Group sessions and Medication administration.  Evaluation of Outcomes: Not Progressing   RN Treatment Plan for Primary Diagnosis: Schizophrenia spectrum disorder with psychotic disorder type not yet determined (HCC) Long Term Goal(s): Knowledge of disease and therapeutic regimen to maintain health will improve  Short Term Goals: Ability to remain free from injury will improve, Ability to verbalize frustration and anger appropriately will improve, Ability to demonstrate self-control, Ability to participate in decision making will improve, Ability to verbalize feelings will improve, Ability to disclose and discuss suicidal ideas, Ability to identify and develop effective coping behaviors will improve, and Compliance with prescribed medications will improve  Medication Management: RN will administer medications as ordered by provider, will assess and evaluate patient's response and provide education to patient for prescribed medication. RN will report any adverse and/or side effects to prescribing provider.  Therapeutic Interventions: 1 on 1 counseling sessions, Psychoeducation, Medication administration, Evaluate responses to treatment, Monitor vital signs and CBGs as ordered, Perform/monitor CIWA, COWS, AIMS and Fall Risk screenings as ordered,  Perform wound care treatments as ordered.  Evaluation of Outcomes: Not Progressing   LCSW Treatment Plan for Primary Diagnosis: Schizophrenia spectrum disorder with psychotic disorder type not yet determined (HCC) Long Term Goal(s): Safe transition to appropriate next level of care at discharge, Engage patient in therapeutic group addressing interpersonal concerns.  Short Term Goals: Engage patient in aftercare planning with referrals and resources, Increase social support, Increase ability to appropriately verbalize feelings, Increase emotional regulation, Facilitate acceptance of mental health diagnosis and concerns, Facilitate patient progression through stages of change regarding substance use diagnoses and concerns, Identify triggers associated with mental health/substance abuse issues, and Increase skills for wellness and recovery  Therapeutic Interventions: Assess for all discharge needs, 1 to 1 time with Social worker, Explore available resources and support systems, Assess for adequacy in community support network, Educate family and significant other(s) on suicide prevention, Complete Psychosocial Assessment, Interpersonal group therapy.  Evaluation of Outcomes: Not Progressing   Progress in Treatment: Attending groups: No. Participating in groups: No. Taking medication as prescribed: Yes. Toleration medication: Yes. Family/Significant other contact made: No, will contact:  [name] Patient understands diagnosis: No. Discussing patient identified problems/goals with staff: Yes. Medical problems stabilized or resolved: Yes. Denies suicidal/homicidal ideation: Yes. Issues/concerns per patient self-inventory: No.   New problem(s) identified: No, Describe:  None reported   New Short Term/Long Term Goal(s):medication stabilization, elimination of SI thoughts, development of comprehensive mental wellness plan.    Patient Goals:  " I don't have any , I did not  want to  come here "   Discharge Plan or Barriers: Patient recently admitted. CSW will continue to follow and assess for appropriate referrals and possible discharge planning.    Reason for Continuation of Hospitalization: Aggression Hallucinations Medication stabilization  Estimated Length of Stay: 5-7 days  Last 3 [place] Suicide Severity Risk Score: Flowsheet Row [facility]/20[facility] Department at [facility]  C-SSRS RISK CATEGORY High Risk No Risk       Last PHQ 2/9 Scores:     No data to display          Scribe for Treatment Team: Beather Arbour 01/23/2023 2:42 PM

## 2023-01-23 NOTE — Progress Notes (Signed)
   01/23/23 2130  Psych Admission Type (Psych Patients Only)  Admission Status Involuntary  Psychosocial Assessment  Patient Complaints Anxiety;Suspiciousness;Irritability  Eye Contact Fair  Facial Expression Sad  Affect Appropriate to circumstance  Speech Logical/coherent  Interaction Cautious;Isolative  Motor Activity Slow  Appearance/Hygiene Disheveled  Behavior Characteristics Agressive verbally;Agitated;Resistant to care  Mood Suspicious;Preoccupied  Aggressive Behavior  Effect No apparent injury  Thought Process  Coherency Circumstantial  Content Religiosity;Preoccupation  Delusions WDL  Perception Hallucinations  Hallucination Visual  Judgment Impaired  Confusion None  Danger to Self  Current suicidal ideation? Denies

## 2023-01-23 NOTE — Progress Notes (Signed)
   01/23/23 0547  15 Minute Checks  Location Bedroom  Visual Appearance Calm  Behavior Composed  Sleep (Behavioral Health Patients Only)  Calculate sleep? (Click Yes once per 24 hr at 0600 safety check) Yes  Documented sleep last 24 hours 9

## 2023-01-23 NOTE — Progress Notes (Signed)
[facility] MD Progress Note  01/23/2023 9:02 AM Elijah Ryan  MRN:  [id number]  Reason for admission:  37 yo Hispanic male with an unknown mental health history, who presented to the [facility] @ [place] on 6/09 under IVC taken out by his family members for manic type symptoms and psychosis; As per documentation from the ER, patient was manic, pacing, seeing devils, saying that he is God, and stating that he would take over the world etc. Patient was transferred on 6/10 by law enforcement to this behavioral health Hospital for treatment and stabilization of his mental status.   Daily notes: (This follow-up evaluation was conducted using a Hispanic interpreter from the language resource). Elijah Ryan is seen in his room. Chart reviewed. The chart findings discussed with the treatment team. He presents with a good affect, good eye contact & verbally responsive. He also presents tangential, disorganized, illogical & delusional. He reports, The authority personnel brought me to the hospital. I just wanted to go back to my country. I went to them & wanted them to take me to the immigration office so that they can deport me. I want to be deported so that I can go home to [facility] to live with my mother. I have been deported from [place] 8 times already. I usually will come back with a train to [place], then here. I have no psychiatric problems. I hear my mother's voice & it is the lady that work here. She said she wants to give me all her things. This lady works with the people that make rains. It's God's prophecy. She can make rain fall & everybody fall also.  I have never been on medication for mental health issues. What I need is, get me out of here, I can see how I can get home. I live in [place] with my girlfriend. She is pregnant. The baby is coming in August". At this time, this patient presents delusional & tangential. His answers to the assessment questions are illogical. Chart review has shown no  prior psychiatric hospitalizations or treatments. His UDS was positive for cannabis. He is currently on medications for sleep, agitations & for anxiety. He remains a poor historian at this time. We are waiting if his current behavior is drug induced, may start to clear in the few days, hopefully, patient will be able to give the staff his actual hx of mental health. The staff reports that patient has not been displaying any behavioral issues. He currently denies any SIHI. There are no changes made on the current plan of care. Will continue as already in progress. Discussed this case with the attending psychiatrist. See treatment plan below.  Principal Problem: Schizophrenia spectrum disorder with psychotic disorder type not yet determined (HCC)  Diagnosis: Principal Problem:   Schizophrenia spectrum disorder with psychotic disorder type not yet determined (HCC) Active Problems:   Delta-9-tetrahydrocannabinol (THC) dependence (HCC)   History of methamphetamine abuse (HCC)   Insomnia  Total Time spent with patient:  35 minutes  Past Psychiatric History: See H&P  Past Medical History: History reviewed. No pertinent past medical history. History reviewed. No pertinent surgical history.  Family History: History reviewed. No pertinent family history.  Family Psychiatric  History: See H&P.  Social History:  Social History   Substance and Sexual Activity  Alcohol Use Not Currently     Social History   Substance and Sexual Activity  Drug Use Not Currently    Social History   Socioeconomic History  Marital status: Single    Spouse name: Not on file   Number of children: Not on file   Years of education: Not on file   Highest education level: Not on file  Occupational History   Not on file  Tobacco Use   Smoking status: Never   Smokeless tobacco: Never  Vaping Use   Vaping Use: Not on file  Substance and Sexual Activity   Alcohol use: Not Currently   Drug use: Not Currently    Sexual activity: Not on file  Other Topics Concern   Not on file  Social History Narrative   Not on file   Social Determinants of Health   Financial Resource Strain: Not on file  Food Insecurity: No Food Insecurity (01/21/2023)   Hunger Vital Sign    Worried About Running Out of Food in the Last Year: Never true    Ran Out of Food in the Last Year: Never true  Transportation Needs: No Transportation Needs (01/21/2023)   PRAPARE - Administrator, Civil Service (Medical): No    Lack of Transportation (Non-Medical): No  Physical Activity: Not on file  Stress: Not on file  Social Connections: Not on file   Additional Social History:   Sleep: Good  Appetite:  Good  Current Medications: Current Facility-Administered Medications  Medication Dose Route Frequency Provider Last Rate Last Admin   acetaminophen (TYLENOL) tablet 650 mg  650 mg Oral Q6H PRN Dixon, Rashaun M, NP       alum & mag hydroxide-simeth (MAALOX/MYLANTA) 200-200-20 MG/5ML suspension 30 mL  30 mL Oral Q4H PRN Dixon, Rashaun M, NP       haloperidol (HALDOL) tablet 5 mg  5 mg Oral TID PRN Massengill, Harrold Donath, MD       And   LORazepam (ATIVAN) tablet 2 mg  2 mg Oral TID PRN Phineas Inches, MD   2 mg at 01/23/23 4098   And   diphenhydrAMINE (BENADRYL) capsule 50 mg  50 mg Oral TID PRN Phineas Inches, MD   50 mg at 01/23/23 1191   haloperidol lactate (HALDOL) injection 5 mg  5 mg Intramuscular TID PRN Massengill, Harrold Donath, MD       And   LORazepam (ATIVAN) injection 2 mg  2 mg Intramuscular TID PRN Phineas Inches, MD   2 mg at 01/22/23 2015   And   diphenhydrAMINE (BENADRYL) injection 50 mg  50 mg Intramuscular TID PRN Phineas Inches, MD   50 mg at 01/22/23 2017   feeding supplement (ENSURE ENLIVE / ENSURE PLUS) liquid 237 mL  237 mL Oral BID BM Massengill, Harrold Donath, MD   237 mL at 01/22/23 0906   haloperidol (HALDOL) tablet 5 mg  5 mg Oral Q12H Massengill, Harrold Donath, MD   5 mg at 01/23/23 4782   Or    haloperidol lactate (HALDOL) injection 5 mg  5 mg Intramuscular Q12H Massengill, Harrold Donath, MD   5 mg at 01/22/23 2016   hydrOXYzine (ATARAX) tablet 25 mg  25 mg Oral TID PRN Jearld Lesch, NP       magnesium hydroxide (MILK OF MAGNESIA) suspension 30 mL  30 mL Oral Daily PRN Jearld Lesch, NP       traZODone (DESYREL) tablet 50 mg  50 mg Oral QHS PRN Jearld Lesch, NP       Lab Results:  Results for orders placed or performed during the hospital encounter of 01/21/23 (from the past 48 hour(s))  CBC  Status: Abnormal   Collection Time: 01/23/23  6:45 AM  Result Value Ref Range   WBC 10.6 (H) 4.0 - 10.5 K/uL   RBC 5.12 4.22 - 5.81 MIL/uL   Hemoglobin 14.6 13.0 - 17.0 g/dL   HCT 16.1 09.6 - 04.5 %   MCV 91.4 80.0 - 100.0 fL   MCH 28.5 26.0 - 34.0 pg   MCHC 31.2 30.0 - 36.0 g/dL   RDW 40.9 81.1 - 91.4 %   Platelets 313 150 - 400 K/uL   nRBC 0.0 0.0 - 0.2 %    Comment: Performed at [facility], [address] [place], [place] [address]  Lipid panel     Status: Abnormal   Collection Time: 01/23/23  6:45 AM  Result Value Ref Range   Cholesterol 221 (H) 0 - 200 mg/dL   Triglycerides 621 (H) <150 mg/dL   HDL 38 (L) >30 mg/dL   Total CHOL/HDL Ratio 5.8 RATIO   VLDL 36 0 - 40 mg/dL   LDL Cholesterol 865 (H) 0 - 99 mg/dL    Comment:        Total Cholesterol/HDL:CHD Risk Coronary Heart Disease Risk Table                     Men   Women  1/2 Average Risk   3.4   3.3  Average Risk       5.0   4.4  2 X Average Risk   9.6   7.1  3 X Average Risk  23.4   11.0        Use the calculated Patient Ratio above and the CHD Risk Table to determine the patient's CHD Risk.        ATP III CLASSIFICATION (LDL):  <100     mg/dL   Optimal  784-696  mg/dL   Near or Above                    Optimal  130-159  mg/dL   Borderline  295-284  mg/dL   High  >132     mg/dL   Very High Performed at [facility], [address], [place], [place] [address]    TSH     Status: None   Collection Time: 01/23/23  6:45 AM  Result Value Ref Range   TSH 2.801 0.350 - 4.500 uIU/mL    Comment: Performed by a 3rd Generation assay with a functional sensitivity of <=0.01 uIU/mL. Performed at [facility], [address] [place], [place] [address]    Blood Alcohol level:  Lab Results  Component Value Date   ETH <10 01/20/2023   Metabolic Disorder Labs: No results found for: "HGBA1C", "MPG" No results found for: "PROLACTIN" Lab Results  Component Value Date   CHOL 221 (H) 01/23/2023   TRIG 178 (H) 01/23/2023   HDL 38 (L) 01/23/2023   CHOLHDL 5.8 01/23/2023   VLDL 36 01/23/2023   LDLCALC 147 (H) 01/23/2023   Physical Findings: AIMS:  , ,  ,  ,    CIWA:    COWS:     Musculoskeletal: Strength & Muscle Tone: within normal limits Gait & Station: normal Patient leans: N/A  Psychiatric Specialty Exam:  Presentation  General Appearance:  Disheveled  Eye Contact: Fair  Speech: Pressured  Speech Volume: Increased  Handedness: Right  Mood and Affect  Mood: Anxious; Irritable; Angry  Affect: Congruent  Thought Process  Thought Processes: Disorganized  Descriptions of Associations:Tangential  Orientation:Partial  Thought Content:Illogical  History of Schizophrenia/Schizoaffective disorder:No  Duration of Psychotic Symptoms:Less than six months  Hallucinations:Hallucinations: Auditory; Visual Description of Auditory Hallucinations: God's voice Description of Visual Hallucinations: Angels and Jesus  Ideas of Reference:Delusions; Paranoia; Percusatory  Suicidal Thoughts:Suicidal Thoughts: No  Homicidal Thoughts:Homicidal Thoughts: No  Sensorium  Memory: Immediate Good  Judgment: Poor  Insight: Poor  Executive Functions  Concentration: Poor  Attention Span: Poor  Recall: Poor  Fund of Knowledge: Poor  Language: Poor  Psychomotor Activity  Psychomotor  Activity: Psychomotor Activity: Normal   Assets  Assets: Resilience; Social Support  Sleep  Sleep: Sleep: Poor  Physical Exam: Physical Exam HENT:     Mouth/Throat:     Pharynx: Oropharynx is clear.  Eyes:     Pupils: Pupils are equal, round, and reactive to light.  Cardiovascular:     Rate and Rhythm: Normal rate.     Pulses: Normal pulses.  Pulmonary:     Effort: Pulmonary effort is normal.  Genitourinary:    Comments: Deferred Musculoskeletal:        General: Normal range of motion.     Cervical back: Normal range of motion.  Skin:    General: Skin is warm.  Neurological:     General: No focal deficit present.     Mental Status: He is alert and oriented to person, place, and time.    Review of Systems  Constitutional:  Negative for chills, diaphoresis and fever.  HENT:  Negative for congestion and sore throat.   Respiratory:  Negative for cough, shortness of breath and wheezing.   Cardiovascular:  Negative for chest pain and palpitations.   Blood pressure 104/67, pulse 98, temperature 98.2 F (36.8 C), temperature source Oral, resp. rate 18, height 5' 6.5" (1.689 m), weight 61.4 kg, SpO2 100 %. Body mass index is 21.53 kg/m.  Treatment Plan Summary: Daily contact with patient to assess and evaluate symptoms and progress in treatment and Medication management.   Continue inpatient hospitalization.  Will continue today 01/23/2023 plan as below except where it is noted.   Diagnoses Principal Problem:   Schizophrenia spectrum disorder with psychotic disorder type not yet determined (HCC) Active Problems:   Delta-9-tetrahydrocannabinol (THC) dependence (HCC)   History of methamphetamine abuse (HCC)   Insomnia   Medications-FORCED MED ORDER IN PLACE- -Start Haldol 5 mg twice daily for psychosis. Forced medication order in place, MUST give IM Haldol 5 mg if patient refuses Haldol 5 mg PO.    PRNS -Continue hydroxyzine 25 mg po tid prn for anxiety -Continue  trazodone 50 mg nightly as needed for sleep -Continue agitation protocol medications: Benadryl 50 mg/Haldol 5 mg/Ativan 2 mg p.o. or IM 3 times daily for agitation as needed -Continue Tylenol 650 mg every 6 hours PRN for mild pain -Continue Maalox 30 mg every 4 hrs PRN for indigestion -Continue Milk of Magnesia as needed every 6 hrs for constipation   Labs reviewed: Orders placed for lipid panel, TSH, hemoglobin A1c, vitamin D, repeat CBC due to elevated WBCs of 14, EKG with QTc within normal limits.   ANTIPSYCHOTIC CONSENT : We discussed the risks, benefits, side effects, and alternatives to THE PRESCRIBED ANTIPSYCHOTIC, including but not limited to, the risk of fatigue, sedation, metabolic syndrome, weight gain, movement abnormalities such as tremor & cogwheeling & tardive dyskinesia, temperature sensitivity, photosensitivity, blood pressure changes, heart rhythm effects, potential for medication interactions, and to not take these medications with alcohol or illicit drugs; informed consent  was obtained. We discussed the necessity for routine monitoring including rating scales of abnormal movements, blood work, and ekgs, while the patient is prescribed antipsychotic medication.    Patient will make reeducation of information above as psychosis clears, as he currently did not comprehend this information in current mental status.   Discharge Planning: Social work and case management to assist with discharge planning and identification of hospital follow-up needs prior to discharge Estimated LOS: 5-7 days Discharge Concerns: Need to establish a safety plan; Medication compliance and effectiveness Discharge Goals: Return home with outpatient referrals for mental health follow-up including medication management/psychotherapy   Armandina Stammer, NP, pmhnp, fnp-bc. 01/23/2023, 9:02 AM

## 2023-01-23 NOTE — Progress Notes (Signed)
Pt given IM agitation protocol. With the assistance of Gabby RN , pt was informed he was getting the IMs due to him spitting out the PO medications. Pt continued to be verbally aggressive , cursing in spanish to staff

## 2023-01-23 NOTE — Progress Notes (Signed)
Agitation protocol ( Ativan 2 mg, benadryl 50 mg, and haldol 5 mg) given to patient at 0829 for increased agitation, anxiety, and irritability.  While patient was at the window with staff to receive his medication,  this am, he states to the staff, "I'm not crazy, you're the one crazy." Patient tolerated medication well with no side effect and distress noted at this time. Staff will continue to provide support to patient.

## 2023-01-23 NOTE — Progress Notes (Addendum)
Pt shouting and cursing at staff. Observed pt punching the window and wall. Pt stated "Yall are some motherfuckers, I am not a puppet. You are trying to sell me like I'm a dog, I am not your bitch. I am the son of God, the protector of peace. You can't mess with me. I will fight you all if I need to and I am not scared to fight. I am not crazy and you are trying to drug me with these medications. You all are keeping me imprisoned.I get visions from the angels and you all don't know and don't understand. The shit you are trying to give me is filled with cocaine and meth." Reassured pt that the medications are safe to take and that we are here to keep him safe. Pt remained verbally agressive.

## 2023-01-24 DIAGNOSIS — F29 Unspecified psychosis not due to a substance or known physiological condition: Secondary | ICD-10-CM | POA: Diagnosis not present

## 2023-01-24 MED ORDER — HALOPERIDOL LACTATE 2 MG/ML PO CONC
10.0000 mg | Freq: Two times a day (BID) | ORAL | Status: DC
  Administered 2023-01-24 – 2023-01-29 (×10): 10 mg via ORAL
  Filled 2023-01-24 (×13): qty 5

## 2023-01-24 MED ORDER — VITAMIN D3 25 MCG PO TABS
1000.0000 [IU] | ORAL_TABLET | Freq: Every day | ORAL | Status: DC
  Administered 2023-01-24 – 2023-01-29 (×6): 1000 [IU] via ORAL
  Filled 2023-01-24 (×7): qty 1

## 2023-01-24 MED ORDER — HALOPERIDOL LACTATE 5 MG/ML IJ SOLN
5.0000 mg | Freq: Two times a day (BID) | INTRAMUSCULAR | Status: DC
  Filled 2023-01-24 (×12): qty 1

## 2023-01-24 NOTE — Progress Notes (Signed)
   01/23/23 2130  Psych Admission Type (Psych Patients Only)  Admission Status Involuntary  Psychosocial Assessment  Patient Complaints Anxiety;Suspiciousness;Irritability  Eye Contact Fair  Facial Expression Sad  Affect Appropriate to circumstance  Speech Logical/coherent  Interaction Cautious;Isolative  Motor Activity Slow  Appearance/Hygiene Disheveled  Behavior Characteristics Agressive verbally;Agitated;Resistant to care  Mood Suspicious;Preoccupied  Aggressive Behavior  Effect No apparent injury  Thought Process  Coherency Circumstantial  Content Religiosity;Preoccupation  Delusions WDL  Perception Hallucinations  Hallucination Visual  Judgment Impaired  Confusion None  Danger to Self  Current suicidal ideation? Denies   Dar Note: Patient presents with calm affect and mood.  Denies suicidal thoughts, auditory and visual hallucinations.  Medications given as prescribed.  Routine safety checks maintained.  Stated he slept well with the medication given last night. Refused to attend group when invited.  Assessment completed via interpreter services.  Patient is safe on and off the unit.

## 2023-01-24 NOTE — [facility] Group Notes (Signed)
Adult Psychoeducational Group Note  Date:  01/24/2023 Time:  3:37 PM  Group Topic/Focus:  Goals Group:   The focus of this group is to help patients establish daily goals to achieve during treatment and discuss how the patient can incorporate goal setting into their daily lives to aide in recovery. Orientation:   The focus of this group is to educate the patient on the purpose and policies of crisis stabilization and provide a format to answer questions about their admission.  The group details unit policies and expectations of patients while admitted.  Participation Level:  Did Not Attend  Participation Quality:    Affect:    Cognitive:    Insight:   Engagement in Group:    Modes of Intervention:    Additional Comments:    Sheran Lawless 01/24/2023, 3:37 PM

## 2023-01-24 NOTE — Progress Notes (Signed)
   01/24/23 2100  Psych Admission Type (Psych Patients Only)  Admission Status Involuntary  Psychosocial Assessment  Patient Complaints Suspiciousness  Eye Contact Fair  Facial Expression Sad  Affect Appropriate to circumstance  Speech Logical/coherent  Interaction Cautious;Isolative  Motor Activity Slow  Appearance/Hygiene Disheveled  Behavior Characteristics Agressive verbally  Mood Suspicious;Preoccupied  Aggressive Behavior  Effect No apparent injury  Thought Process  Coherency Circumstantial  Content Religiosity;Preoccupation  Delusions WDL  Perception Hallucinations  Hallucination Visual  Judgment Impaired  Confusion None  Danger to Self  Current suicidal ideation? Denies

## 2023-01-24 NOTE — [facility] Group Notes (Signed)
[facility] Group Notes:  (Nursing/MHT/Case Management/Adjunct)  Date:  01/24/2023  Time:  9:28 PM  Type of Therapy:  Group Therapy  Participation Level:  Active  Participation Quality:  Appropriate  Affect:  Appropriate  Cognitive:  Appropriate  Insight:  Appropriate  Engagement in Group:  Engaged  Modes of Intervention:  Activity, Education, and Problem-solving  Summary of Progress/Problems:Music Therapy as a way to relieve anxiety, stress.  Tacy Dura 01/24/2023, 9:28 PM

## 2023-01-24 NOTE — Group Note (Signed)
Recreation Therapy Group Note   Group Topic:Leisure Education  Group Date: 01/24/2023 Start Time: 1003 End Time: 1045 Facilitators: Shandora Koogler-McCall, LRT,CTRS Location: [address] Dayroom   Goal Area(s) Addresses:  Patient will identify positive leisure activities for use post discharge. Patient will identify at least one positive benefit of participation in leisure activities.   Group Description: [employer]. Patients were placed in a medium size circle. Patients were to hit the beach ball, as if playing volleyball, to each other. LRT will keep time while patients keep the ball going for as long as possible without it coming to a complete stop. When the ball comes to a stop, the time will start over. LRT will also play music in the background with a summer feel.   Affect/Mood: N/A   Participation Level: Did not attend    Clinical Observations/Individualized Feedback:     Plan: Continue to engage patient in RT group sessions 2-3x/week.   Lola Czerwonka-McCall, LRT,CTRS 01/24/2023 11:37 AM

## 2023-01-24 NOTE — Progress Notes (Signed)
[facility] MD Progress Note  01/24/2023 5:52 PM Elijah Ryan  MRN:  [id number]  Reason for admission:  37 yo Hispanic male with an unknown mental health history, who presented to the [facility] @ [place] on 6/09 under IVC taken out by his family members for manic type symptoms and psychosis; As per documentation from the ER, patient was manic, pacing, seeing devils, saying that he is God, and stating that he would take over the world etc. Patient was transferred on 6/10 by law enforcement to this behavioral health Hospital for treatment and stabilization of his mental status.   Daily notes: (This follow-up evaluation was conducted using a Hispanic interpreter from a [facility] employee). Elijah Ryan is seen in his room. Chart reviewed. The chart findings discussed with the treatment team. He presents with a good affect, good eye contact & verbally responsive. He was reading his bible. He presents more settled & clear minded this morning. He reports, "I'm doing well this morning. However, I over-slept this morning because they gave me a shot last night. My mood feels ok but I feel like my vision is blurry & body weak. I feel very sleepy too. I have no depression. The hallucinations has stopped. When am I going to be discharged? The staff had reported that patient after getting his medication yesterday evening, cheeked it & later spat it out. At the time, report indicated that he was also being verbally aggressive, cursing-out the staff in [place]. To assure that he gets his medication, he was given the injectable version of his medication. So as of today, his Haldol has been changed to the liquid form to allow patient to easily swallow his medication without any chance of spitting it out. Patient seems more civil today than yesterday. The treatment team will continue to treat & monitor his tolerance to his treatment regimen. Reviewed vital signs, pulse rate slightly elevated (108). The rest of the vital signs are  stable. Will recheck. He is in no apparent distress. Reviewed current lab results. Will continue current plan of care as already in progress.  Principal Problem: Schizophrenia spectrum disorder with psychotic disorder type not yet determined (HCC)  Diagnosis: Principal Problem:   Schizophrenia spectrum disorder with psychotic disorder type not yet determined (HCC) Active Problems:   Delta-9-tetrahydrocannabinol (THC) dependence (HCC)   History of methamphetamine abuse (HCC)   Insomnia  Total Time spent with patient:  35 minutes  Past Psychiatric History: See H&P  Past Medical History: History reviewed. No pertinent past medical history. History reviewed. No pertinent surgical history.  Family History: History reviewed. No pertinent family history.  Family Psychiatric  History: See H&P.  Social History:  Social History   Substance and Sexual Activity  Alcohol Use Not Currently     Social History   Substance and Sexual Activity  Drug Use Not Currently    Social History   Socioeconomic History   Marital status: Single    Spouse name: Not on file   Number of children: Not on file   Years of education: Not on file   Highest education level: Not on file  Occupational History   Not on file  Tobacco Use   Smoking status: Never   Smokeless tobacco: Never  Vaping Use   Vaping Use: Not on file  Substance and Sexual Activity   Alcohol use: Not Currently   Drug use: Not Currently   Sexual activity: Not on file  Other Topics Concern   Not on file  Social History Narrative   Not on file   Social Determinants of Health   Financial Resource Strain: Not on file  Food Insecurity: No Food Insecurity (01/21/2023)   Hunger Vital Sign    Worried About Running Out of Food in the Last Year: Never true    Ran Out of Food in the Last Year: Never true  Transportation Needs: No Transportation Needs (01/21/2023)   PRAPARE - Administrator, Civil Service (Medical): No     Lack of Transportation (Non-Medical): No  Physical Activity: Not on file  Stress: Not on file  Social Connections: Not on file   Additional Social History:   Sleep: Good  Appetite:  Good  Current Medications: Current Facility-Administered Medications  Medication Dose Route Frequency Provider Last Rate Last Admin   acetaminophen (TYLENOL) tablet 650 mg  650 mg Oral Q6H PRN Dixon, Rashaun M, NP       alum & mag hydroxide-simeth (MAALOX/MYLANTA) 200-200-20 MG/5ML suspension 30 mL  30 mL Oral Q4H PRN Dixon, Rashaun M, NP       benztropine (COGENTIN) tablet 0.5 mg  0.5 mg Oral Q12H Massengill, Nathan, MD   0.5 mg at 01/24/23 7829   haloperidol (HALDOL) tablet 5 mg  5 mg Oral TID PRN Phineas Inches, MD       And   LORazepam (ATIVAN) tablet 2 mg  2 mg Oral TID PRN Phineas Inches, MD   2 mg at 01/23/23 5621   And   diphenhydrAMINE (BENADRYL) capsule 50 mg  50 mg Oral TID PRN Phineas Inches, MD   50 mg at 01/23/23 3086   haloperidol lactate (HALDOL) injection 5 mg  5 mg Intramuscular TID PRN Phineas Inches, MD   5 mg at 01/23/23 2004   And   LORazepam (ATIVAN) injection 2 mg  2 mg Intramuscular TID PRN Phineas Inches, MD   2 mg at 01/23/23 2004   And   diphenhydrAMINE (BENADRYL) injection 50 mg  50 mg Intramuscular TID PRN Phineas Inches, MD   50 mg at 01/23/23 2003   feeding supplement (ENSURE ENLIVE / ENSURE PLUS) liquid 237 mL  237 mL Oral BID BM Massengill, Harrold Donath, MD   237 mL at 01/23/23 1457   haloperidol (HALDOL) 2 MG/ML solution 10 mg  10 mg Oral Q12H Massengill, Harrold Donath, MD       Or   haloperidol lactate (HALDOL) injection 5 mg  5 mg Intramuscular Q12H Massengill, Harrold Donath, MD       hydrOXYzine (ATARAX) tablet 25 mg  25 mg Oral TID PRN Jearld Lesch, NP       magnesium hydroxide (MILK OF MAGNESIA) suspension 30 mL  30 mL Oral Daily PRN Durwin Nora, Rashaun M, NP       traZODone (DESYREL) tablet 50 mg  50 mg Oral QHS PRN Jearld Lesch, NP       vitamin D3  (CHOLECALCIFEROL) tablet 1,000 Units  1,000 Units Oral Daily Massengill, Harrold Donath, MD   1,000 Units at 01/24/23 0827   Lab Results:  Results for orders placed or performed during the hospital encounter of 01/21/23 (from the past 48 hour(s))  CBC     Status: Abnormal   Collection Time: 01/23/23  6:45 AM  Result Value Ref Range   WBC 10.6 (H) 4.0 - 10.5 K/uL   RBC 5.12 4.22 - 5.81 MIL/uL   Hemoglobin 14.6 13.0 - 17.0 g/dL   HCT 57.8 46.9 - 62.9 %   MCV 91.4 80.0 - 100.0 fL  MCH 28.5 26.0 - 34.0 pg   MCHC 31.2 30.0 - 36.0 g/dL   RDW 40.9 81.1 - 91.4 %   Platelets 313 150 - 400 K/uL   nRBC 0.0 0.0 - 0.2 %    Comment: Performed at [facility], [address], [place], [place] [address]  Lipid panel     Status: Abnormal   Collection Time: 01/23/23  6:45 AM  Result Value Ref Range   Cholesterol 221 (H) 0 - 200 mg/dL   Triglycerides 621 (H) <150 mg/dL   HDL 38 (L) >30 mg/dL   Total CHOL/HDL Ratio 5.8 RATIO   VLDL 36 0 - 40 mg/dL   LDL Cholesterol 865 (H) 0 - 99 mg/dL    Comment:        Total Cholesterol/HDL:CHD Risk Coronary Heart Disease Risk Table                     Men   Women  1/2 Average Risk   3.4   3.3  Average Risk       5.0   4.4  2 X Average Risk   9.6   7.1  3 X Average Risk  23.4   11.0        Use the calculated Patient Ratio above and the CHD Risk Table to determine the patient's CHD Risk.        ATP III CLASSIFICATION (LDL):  <100     mg/dL   Optimal  784-696  mg/dL   Near or Above                    Optimal  130-159  mg/dL   Borderline  295-284  mg/dL   High  >132     mg/dL   Very High Performed at [facility], [address], [place], [place] [address]   Hemoglobin A1c     Status: None   Collection Time: 01/23/23  6:45 AM  Result Value Ref Range   Hgb A1c MFr Bld 5.6 4.8 - 5.6 %    Comment: (NOTE) Pre diabetes:          5.7%-6.4%  Diabetes:              >6.4%  Glycemic control for   <7.0% adults with diabetes     Mean Plasma Glucose 114.02 mg/dL    Comment: Performed at [facility], [address], [place], [place] [address]  VITAMIN D 25 Hydroxy (Vit-D Deficiency, Fractures)     Status: Abnormal   Collection Time: 01/23/23  6:45 AM  Result Value Ref Range   Vit D, 25-Hydroxy 20.33 (L) 30 - 100 ng/mL    Comment: (NOTE) Vitamin D deficiency has been defined by the Institute of Medicine  and an Endocrine Society practice guideline as a level of serum 25-OH  vitamin D less than 20 ng/mL (1,2). The Endocrine Society went on to  further define vitamin D insufficiency as a level between 21 and 29  ng/mL (2).  1. IOM (Institute of Medicine). 2010. Dietary reference intakes for  calcium and D. [place]: [employer]. 2. Holick MF, Binkley [place], Bischoff-Ferrari HA, et al. Evaluation,  treatment, and prevention of vitamin D deficiency: an Endocrine  Society clinical practice guideline, JCEM. 2011 Jul; 96(7): 1911-30.  Performed at [facility], [address], [place], [place] [address]   TSH     Status: None  Collection Time: 01/23/23  6:45 AM  Result Value Ref Range   TSH 2.801 0.350 - 4.500 uIU/mL    Comment: Performed by a 3rd Generation assay with a functional sensitivity of <=0.01 uIU/mL. Performed at [facility], [address] [place], [place] [address]    Blood Alcohol level:  Lab Results  Component Value Date   ETH <10 01/20/2023   Metabolic Disorder Labs: Lab Results  Component Value Date   HGBA1C 5.6 01/23/2023   MPG 114.02 01/23/2023   No results found for: "PROLACTIN" Lab Results  Component Value Date   CHOL 221 (H) 01/23/2023   TRIG 178 (H) 01/23/2023   HDL 38 (L) 01/23/2023   CHOLHDL 5.8 01/23/2023   VLDL 36 01/23/2023   LDLCALC 147 (H) 01/23/2023   Physical Findings: AIMS:  , ,  ,  ,    CIWA:    COWS:     Musculoskeletal: Strength & Muscle Tone: within normal limits Gait & Station: normal Patient leans:  N/A  Psychiatric Specialty Exam:  Presentation  General Appearance:  Casual; Fairly Groomed  Eye Contact: Good  Speech: Clear and Coherent; Normal Rate  Speech Volume: Normal  Handedness: Right  Mood and Affect  Mood: -- (Worrying about getting discharged)  Affect: Congruent; Appropriate  Thought Process  Thought Processes: Coherent; Goal Directed; Linear  Descriptions of Associations:Intact  Orientation:Full (Time, Place and Person)  Thought Content:Logical  History of Schizophrenia/Schizoaffective disorder:Yes  Duration of Psychotic Symptoms:Less than six months  Hallucinations:Hallucinations: None Description of Auditory Hallucinations: NA Description of Visual Hallucinations: NA   Ideas of Reference:None  Suicidal Thoughts:Suicidal Thoughts: No   Homicidal Thoughts:Homicidal Thoughts: No   Sensorium  Memory: Recent Good; Remote Fair  Judgment: Fair  Insight: Fair  Art therapist  Concentration: Good  Attention Span: Good  Recall: Fair  Fund of Knowledge: Fair  Language: Fair  Psychomotor Activity  Psychomotor Activity: Psychomotor Activity: Normal    Assets  Assets: Desire for Improvement; Communication Skills; Housing; Health and safety inspector; Physical Health; Resilience; Social Support  Sleep  Sleep: Sleep: Good Number of Hours of Sleep: 8   Physical Exam: Physical Exam HENT:     Mouth/Throat:     Pharynx: Oropharynx is clear.  Eyes:     Pupils: Pupils are equal, round, and reactive to light.  Cardiovascular:     Rate and Rhythm: Normal rate.     Pulses: Normal pulses.  Pulmonary:     Effort: Pulmonary effort is normal.  Genitourinary:    Comments: Deferred Musculoskeletal:        General: Normal range of motion.     Cervical back: Normal range of motion.  Skin:    General: Skin is warm.  Neurological:     General: No focal deficit present.     Mental Status: He is alert and oriented  to person, place, and time.    Review of Systems  Constitutional:  Negative for chills, diaphoresis and fever.  HENT:  Negative for congestion and sore throat.   Respiratory:  Negative for cough, shortness of breath and wheezing.   Cardiovascular:  Negative for chest pain and palpitations.   Blood pressure 94/83, pulse (!) 108, temperature 98.4 F (36.9 C), temperature source Oral, resp. rate 18, height 5' 6.5" (1.689 m), weight 61.4 kg, SpO2 100 %. Body mass index is 21.53 kg/m.  Treatment Plan Summary: Daily contact with patient to assess and evaluate symptoms and progress in treatment and Medication management.   Continue inpatient hospitalization.  Will continue today 01/24/2023 plan as below except where it is noted.   Diagnoses Principal Problem:   Schizophrenia spectrum disorder with psychotic disorder type not yet determined (HCC) Active Problems:   Delta-9-tetrahydrocannabinol (THC) dependence (HCC)   History of methamphetamine abuse (HCC)   Insomnia   Medications-FORCED MED ORDER IN PLACE- -Continue Haldol  solution 10 mg po bid for psychosis. Forced medication order in place, MUST give IM Haldol 5 mg if patient refuses Haldol 5 mg PO.    PRNS -Continue hydroxyzine 25 mg po tid prn for anxiety -Continue trazodone 50 mg nightly as needed for sleep.  Agitation protocols: Cont as recommended;  -Benadryl 50 mg po or IM tid prn. -Haldol 5 mg po or IM tid prn.  -Lorazepam 2 mg po or IM tid prn.  -Continue Tylenol 650 mg every 6 hours PRN for mild pain -Continue Maalox 30 mg every 4 hrs PRN for indigestion -Continue Milk of Magnesia as needed every 6 hrs for constipation   Labs reviewed:  Lipid panel: Chol. 221, HDL 38-low, LDL 147 (H), Trigl 178 ((H). TSH, 2.801 HGBA1c, 5.6. Vitamin D, 20.33 (low).  CBC due to elevated WBCs of 10.6 (trending down).,   EKG with QTc within normal limits.   ANTIPSYCHOTIC CONSENT : We discussed the risks, benefits, side effects, and  alternatives to THE PRESCRIBED ANTIPSYCHOTIC, including but not limited to, the risk of fatigue, sedation, metabolic syndrome, weight gain, movement abnormalities such as tremor & cogwheeling & tardive dyskinesia, temperature sensitivity, photosensitivity, blood pressure changes, heart rhythm effects, potential for medication interactions, and to not take these medications with alcohol or illicit drugs; informed consent was obtained. We discussed the necessity for routine monitoring including rating scales of abnormal movements, blood work, and ekgs, while the patient is prescribed antipsychotic medication.    Patient will make reeducation of information above as psychosis clears, as he currently did not comprehend this information in current mental status.   Discharge Planning: Social work and case management to assist with discharge planning and identification of hospital follow-up needs prior to discharge Estimated LOS: 5-7 days Discharge Concerns: Need to establish a safety plan; Medication compliance and effectiveness Discharge Goals: Return home with outpatient referrals for mental health follow-up including medication management/psychotherapy   Armandina Stammer, NP, pmhnp, fnp-bc. 01/24/2023, 5:52 PMPatient ID: Elijah Ryan, male   DOB: February 01, 1986, 37 y.o.   MRN: [id number]

## 2023-01-25 DIAGNOSIS — F29 Unspecified psychosis not due to a substance or known physiological condition: Secondary | ICD-10-CM | POA: Diagnosis not present

## 2023-01-25 NOTE — [facility] Suicide Risk Assessment (Signed)
[facility] INPATIENT:  Family/Significant Other Suicide Prevention Education  Suicide Prevention Education:  Education Completed; Elijah Ryan, [phone] ,  (name of family member/significant other) has been identified by the patient as the family member/significant other with whom the patient will be residing, and identified as the person(s) who will aid the patient in the event of a mental health crisis (suicidal ideations/suicide attempt).  With written consent from the patient, the family member/significant other has been provided the following suicide prevention education, prior to the and/or following the discharge of the patient.  CSW spoke to patient girlfriend who states that patient is not safe to go home.  She believes he has been through quite a bit of trauma in [place] and if he returns he is probably going to not survive.  She reports that patient has been threatening, seeing things, doing drugs and acting bizarrely.  She reports that she is here to support him when he is discharged.  No access to guns or weapons at this time.    The suicide prevention education provided includes the following: Suicide risk factors Suicide prevention and interventions National Suicide Hotline telephone number [facility] assessment telephone number [facility] Emergency Assistance 911 [facility] and/or Residential Mobile Crisis Unit telephone number  Request made of family/significant other to: Remove weapons (e.g., guns, rifles, knives), all items previously/currently identified as safety concern.   Remove drugs/medications (over-the-counter, prescriptions, illicit drugs), all items previously/currently identified as a safety concern.  The family member/significant other verbalizes understanding of the suicide prevention education information provided.  The family member/significant other agrees to remove the items of safety concern listed above.  Erienne Spelman E  Redith Drach 01/25/2023, 2:14 PM

## 2023-01-25 NOTE — Progress Notes (Signed)
   01/24/23 2100  Psych Admission Type (Psych Patients Only)  Admission Status Involuntary  Psychosocial Assessment  Patient Complaints Suspiciousness  Eye Contact Fair  Facial Expression Sad  Affect Appropriate to circumstance  Speech Logical/coherent  Interaction Cautious;Isolative  Motor Activity Slow  Appearance/Hygiene Disheveled  Behavior Characteristics Agressive verbally  Mood Suspicious;Preoccupied  Aggressive Behavior  Effect No apparent injury  Thought Process  Coherency Circumstantial  Content Religiosity;Preoccupation  Delusions WDL  Perception Hallucinations  Hallucination Visual  Judgment Impaired  Confusion None  Danger to Self  Current suicidal ideation? Denies    

## 2023-01-25 NOTE — Progress Notes (Signed)
Pt more receptive to writer this evening, pt talked with Clinical research associate with the assistance from spanish speaking RN.     01/25/23 2215  Psych Admission Type (Psych Patients Only)  Admission Status Involuntary  Psychosocial Assessment  Patient Complaints Suspiciousness  Eye Contact Fair  Facial Expression Sad  Affect Appropriate to circumstance  Speech Logical/coherent  Interaction Cautious;Isolative  Motor Activity Slow  Appearance/Hygiene Disheveled  Behavior Characteristics Cooperative  Mood Preoccupied;Pleasant  Aggressive Behavior  Effect No apparent injury  Thought Process  Coherency Circumstantial  Content Religiosity;Preoccupation  Delusions WDL  Perception Hallucinations  Hallucination Visual  Judgment Impaired  Confusion None  Danger to Self  Current suicidal ideation? Denies

## 2023-01-25 NOTE — Progress Notes (Signed)
[facility] MD Progress Note  01/25/2023 5:45 PM Elijah Ryan  MRN:  [id number]  Reason for admission:  37 yo Hispanic male with an unknown mental health history, who presented to the [facility] [place] on 6/09 under IVC taken out by his family members for manic type symptoms and psychosis; As per documentation from the ER, patient was manic, pacing, seeing devils, saying that he is God, and stating that he would take over the world etc. Patient was transferred on 6/10 by law enforcement to this behavioral health Hospital for treatment and stabilization of his mental status.   Daily notes: (This follow-up evaluation was conducted using a Hispanic interpreter from the language resource). Anay is seen in his room. Chart reviewed. The chart findings discussed with the treatment team. He was sitting up on his bed reading his bible. He presents with an improving  affect, good eye contact & verbally responsive. He reports, "I'm doing well. I have no depression or anxiety today, but I'm worried about my pregnant wife. She is alone. I need to be there with her & for her. I'm taking the medicines you guys are giving me. They are making me feel too sleepy.  I like groups, but I like to sit in my room & read my bible". Savior currently denies any SIHI, AVH, delusional thoughts or paranoia. He does not appear to be responding to any internal stimuli. Patient appears to be progressing well. This provider contacted patient's wife [name] [phone]. She states that she talked to [facility] today [employer] sounded upset. She adds that she did not know why he was mad because he would not tell her. However, she states that she will be coming down to see him this evening during visiting hours. [name] was encouraged to update the treatment team on whether she thinks that Khamren is approaching his baseline. We will continue current plan of care as already in progress.  Principal Problem: Schizophrenia spectrum disorder  with psychotic disorder type not yet determined (HCC)  Diagnosis: Principal Problem:   Schizophrenia spectrum disorder with psychotic disorder type not yet determined (HCC) Active Problems:   Delta-9-tetrahydrocannabinol (THC) dependence (HCC)   History of methamphetamine abuse (HCC)   Insomnia  Total Time spent with patient:  35 minutes  Past Psychiatric History: See H&P  Past Medical History: History reviewed. No pertinent past medical history. History reviewed. No pertinent surgical history.  Family History: History reviewed. No pertinent family history.  Family Psychiatric  History: See H&P.  Social History:  Social History   Substance and Sexual Activity  Alcohol Use Not Currently     Social History   Substance and Sexual Activity  Drug Use Not Currently    Social History   Socioeconomic History   Marital status: Single    Spouse name: Not on file   Number of children: Not on file   Years of education: Not on file   Highest education level: Not on file  Occupational History   Not on file  Tobacco Use   Smoking status: Never   Smokeless tobacco: Never  Vaping Use   Vaping Use: Not on file  Substance and Sexual Activity   Alcohol use: Not Currently   Drug use: Not Currently   Sexual activity: Not on file  Other Topics Concern   Not on file  Social History Narrative   Not on file   Social Determinants of Health   Financial Resource Strain: Not on file  Food  Insecurity: No Food Insecurity (01/21/2023)   Hunger Vital Sign    Worried About Running Out of Food in the Last Year: Never true    Ran Out of Food in the Last Year: Never true  Transportation Needs: No Transportation Needs (01/21/2023)   PRAPARE - Administrator, Civil Service (Medical): No    Lack of Transportation (Non-Medical): No  Physical Activity: Not on file  Stress: Not on file  Social Connections: Not on file   Additional Social History:   Sleep: Good  Appetite:   Good  Current Medications: Current Facility-Administered Medications  Medication Dose Route Frequency Provider Last Rate Last Admin   acetaminophen (TYLENOL) tablet 650 mg  650 mg Oral Q6H PRN Dixon, Rashaun M, NP       alum & mag hydroxide-simeth (MAALOX/MYLANTA) 200-200-20 MG/5ML suspension 30 mL  30 mL Oral Q4H PRN Dixon, Rashaun M, NP       benztropine (COGENTIN) tablet 0.5 mg  0.5 mg Oral Q12H Massengill, Nathan, MD   0.5 mg at 01/25/23 4098   haloperidol (HALDOL) tablet 5 mg  5 mg Oral TID PRN Phineas Inches, MD       And   LORazepam (ATIVAN) tablet 2 mg  2 mg Oral TID PRN Phineas Inches, MD   2 mg at 01/23/23 1191   And   diphenhydrAMINE (BENADRYL) capsule 50 mg  50 mg Oral TID PRN Phineas Inches, MD   50 mg at 01/23/23 4782   haloperidol lactate (HALDOL) injection 5 mg  5 mg Intramuscular TID PRN Phineas Inches, MD   5 mg at 01/23/23 2004   And   LORazepam (ATIVAN) injection 2 mg  2 mg Intramuscular TID PRN Phineas Inches, MD   2 mg at 01/23/23 2004   And   diphenhydrAMINE (BENADRYL) injection 50 mg  50 mg Intramuscular TID PRN Phineas Inches, MD   50 mg at 01/23/23 2003   feeding supplement (ENSURE ENLIVE / ENSURE PLUS) liquid 237 mL  237 mL Oral BID BM Massengill, Harrold Donath, MD   237 mL at 01/23/23 1457   haloperidol (HALDOL) 2 MG/ML solution 10 mg  10 mg Oral Q12H Massengill, Harrold Donath, MD   10 mg at 01/25/23 0820   Or   haloperidol lactate (HALDOL) injection 5 mg  5 mg Intramuscular Q12H Massengill, Harrold Donath, MD       hydrOXYzine (ATARAX) tablet 25 mg  25 mg Oral TID PRN Jearld Lesch, NP       magnesium hydroxide (MILK OF MAGNESIA) suspension 30 mL  30 mL Oral Daily PRN Jearld Lesch, NP       traZODone (DESYREL) tablet 50 mg  50 mg Oral QHS PRN Jearld Lesch, NP   50 mg at 01/24/23 1937   vitamin D3 (CHOLECALCIFEROL) tablet 1,000 Units  1,000 Units Oral Daily Phineas Inches, MD   1,000 Units at 01/25/23 9562   Lab Results:  No results found for  this or any previous visit (from the past 48 hour(s)).  Blood Alcohol level:  Lab Results  Component Value Date   ETH <10 01/20/2023   Metabolic Disorder Labs: Lab Results  Component Value Date   HGBA1C 5.6 01/23/2023   MPG 114.02 01/23/2023   No results found for: "PROLACTIN" Lab Results  Component Value Date   CHOL 221 (H) 01/23/2023   TRIG 178 (H) 01/23/2023   HDL 38 (L) 01/23/2023   CHOLHDL 5.8 01/23/2023   VLDL 36 01/23/2023   LDLCALC 147 (H)  01/23/2023   Physical Findings: AIMS:  , ,  ,  ,    CIWA:    COWS:     Musculoskeletal: Strength & Muscle Tone: within normal limits Gait & Station: normal Patient leans: N/A  Psychiatric Specialty Exam:  Presentation  General Appearance:  Casual; Fairly Groomed  Eye Contact: Good  Speech: Clear and Coherent; Normal Rate  Speech Volume: Normal  Handedness: Right  Mood and Affect  Mood: -- (Worrying about getting discharged)  Affect: Congruent; Appropriate  Thought Process  Thought Processes: Coherent; Goal Directed; Linear  Descriptions of Associations:Intact  Orientation:Full (Time, Place and Person)  Thought Content:Logical  History of Schizophrenia/Schizoaffective disorder:Yes  Duration of Psychotic Symptoms:Less than six months  Hallucinations:Hallucinations: None Description of Auditory Hallucinations: NA Description of Visual Hallucinations: NA   Ideas of Reference:None  Suicidal Thoughts:Suicidal Thoughts: No   Homicidal Thoughts:Homicidal Thoughts: No   Sensorium  Memory: Recent Good; Remote Fair  Judgment: Fair  Insight: Fair  Art therapist  Concentration: Good  Attention Span: Good  Recall: Fair  Fund of Knowledge: Fair  Language: Fair  Psychomotor Activity  Psychomotor Activity: Psychomotor Activity: Normal    Assets  Assets: Desire for Improvement; Communication Skills; Housing; Health and safety inspector; Physical Health; Resilience;  Social Support  Sleep  Sleep: Sleep: Good Number of Hours of Sleep: 8   Physical Exam: Physical Exam HENT:     Mouth/Throat:     Pharynx: Oropharynx is clear.  Eyes:     Pupils: Pupils are equal, round, and reactive to light.  Cardiovascular:     Rate and Rhythm: Normal rate.     Pulses: Normal pulses.  Pulmonary:     Effort: Pulmonary effort is normal.  Genitourinary:    Comments: Deferred Musculoskeletal:        General: Normal range of motion.     Cervical back: Normal range of motion.  Skin:    General: Skin is warm.  Neurological:     General: No focal deficit present.     Mental Status: He is alert and oriented to person, place, and time.    Review of Systems  Constitutional:  Negative for chills, diaphoresis and fever.  HENT:  Negative for congestion and sore throat.   Respiratory:  Negative for cough, shortness of breath and wheezing.   Cardiovascular:  Negative for chest pain and palpitations.   Blood pressure 111/74, pulse 88, temperature 98.9 F (37.2 C), temperature source Oral, resp. rate 18, height 5' 6.5" (1.689 m), weight 61.4 kg, SpO2 100 %. Body mass index is 21.53 kg/m.  Treatment Plan Summary: Daily contact with patient to assess and evaluate symptoms and progress in treatment and Medication management.   Continue inpatient hospitalization.  Will continue today 01/25/2023 plan as below except where it is noted.   Diagnoses Principal Problem:   Schizophrenia spectrum disorder with psychotic disorder type not yet determined (HCC) Active Problems:   Delta-9-tetrahydrocannabinol (THC) dependence (HCC)   History of methamphetamine abuse (HCC)   Insomnia   Medications-FORCED MED ORDER IN PLACE- -Continue Haldol  solution 10 mg po bid for psychosis. Forced medication order in place, MUST give IM Haldol 5 mg if patient refuses Haldol 5 mg PO.    PRNS -Continue hydroxyzine 25 mg po tid prn for anxiety -Continue trazodone 50 mg nightly as needed  for sleep.  -Continue benztropine 0.5 mg po bid for eps prevention.  -Continue Vit. D3 1,000 units po daily for bone health.  Agitation protocols: Cont as recommended;  -Benadryl  50 mg po or IM tid prn. -Haldol 5 mg po or IM tid prn.  -Lorazepam 2 mg po or IM tid prn.  -Continue Tylenol 650 mg every 6 hours PRN for mild pain -Continue Maalox 30 mg every 4 hrs PRN for indigestion -Continue Milk of Magnesia as needed every 6 hrs for constipation   Labs reviewed:  Lipid panel: Chol. 221, HDL 38-low, LDL 147 (H), Trigl 178 ((H). TSH, 2.801 HGBA1c, 5.6. Vitamin D, 20.33 (low).  CBC due to elevated WBCs of 10.6 (trending down).,   EKG with QTc within normal limits.   ANTIPSYCHOTIC CONSENT : We discussed the risks, benefits, side effects, and alternatives to THE PRESCRIBED ANTIPSYCHOTIC, including but not limited to, the risk of fatigue, sedation, metabolic syndrome, weight gain, movement abnormalities such as tremor & cogwheeling & tardive dyskinesia, temperature sensitivity, photosensitivity, blood pressure changes, heart rhythm effects, potential for medication interactions, and to not take these medications with alcohol or illicit drugs; informed consent was obtained. We discussed the necessity for routine monitoring including rating scales of abnormal movements, blood work, and ekgs, while the patient is prescribed antipsychotic medication.    Patient will make reeducation of information above as psychosis clears, as he currently did not comprehend this information in current mental status.   Discharge Planning: Social work and case management to assist with discharge planning and identification of hospital follow-up needs prior to discharge Estimated LOS: 5-7 days Discharge Concerns: Need to establish a safety plan; Medication compliance and effectiveness Discharge Goals: Return home with outpatient referrals for mental health follow-up including medication management/psychotherapy    Armandina Stammer, NP, pmhnp, fnp-bc. 01/25/2023, 5:45 PMPatient ID: Tildon Husky, male   DOB: 1986-03-12, 37 y.o.   MRN: [id number] Patient ID: [facility] Amaziah Croan, male   DOB: [birth year], 37 y.o.   MRN: [id number]

## 2023-01-25 NOTE — [facility] Group Notes (Signed)
Pt was encouraged but refused to attend group discussion  

## 2023-01-25 NOTE — [facility] Group Notes (Signed)
Spiritual care group facilitated by Chaplain Dyanne Carrel, [facility]   Group focused on topic of strength. Group members reflected on what thoughts and feelings emerge when they hear this topic. They then engaged in facilitated dialog around how strength is present in their lives. This dialog focused on representing what strength had been to them in their lives (images and patterns given) and what they saw as helpful in their life now (what they needed / wanted).  Activity drew on narrative framework.  Patient Progress: Elijah Ryan participated in group with the assistance of interpreter. He shared about his strength coming from his faith and many of his comments focused on his faith and prayer.

## 2023-01-25 NOTE — Group Note (Signed)
Date:  01/25/2023 Time:  2:41 PM  Group Topic/Focus:  Goals Group:   The focus of this group is to help patients establish daily goals to achieve during treatment and discuss how the patient can incorporate goal setting into their daily lives to aide in recovery. Orientation:   The focus of this group is to educate the patient on the purpose and policies of crisis stabilization and provide a format to answer questions about their admission.  The group details unit policies and expectations of patients while admitted.    Participation Level:  Active  Participation Quality:  Appropriate  Affect:  Appropriate  Cognitive:  Appropriate  Insight: Appropriate  Engagement in Group:  Engaged  Modes of Intervention:  Discussion  Additional Comments:  Patient attended morning orientation/goal group and said that his goal for today is to feel better.   Neville Walston W Carmeline Kowal 01/25/2023, 2:41 PM

## 2023-01-26 DIAGNOSIS — F29 Unspecified psychosis not due to a substance or known physiological condition: Secondary | ICD-10-CM | POA: Diagnosis not present

## 2023-01-26 NOTE — Progress Notes (Signed)
[facility] MD Progress Note  01/26/2023 4:23 PM Elijah Ryan  MRN:  [id number]  Reason for admission:  37 yo Hispanic male with an unknown mental health history, who presented to the [facility] ER @ [place] on 6/09 under IVC taken out by his family members for manic type symptoms and psychosis; As per documentation from the ER, patient was manic, pacing, seeing devils, saying that he is God, and stating that he would take over the world etc. Patient was transferred on 6/10 by law enforcement to this behavioral health [facility] for treatment and stabilization of his mental status.   Daily notes: (This follow-up evaluation was conducted using a Hispanic interpreter from the language resource): Elijah Ryan is seen in his room. Chart reviewed. The chart findings discussed with the treatment team. He was sitting up on his bed reading his bible. During the introduction phase of this follow-up evaluation, Elijah Ryan presents calm, civil & pleasant. However, when he asked about his discharge date? And when this provider informed him that he will not be discharged today, Elijah Ryan's attitude changed from calm/pleasant to serious. He argued that he is doing well only that the medicines he is being giving are making drowsy & tired. He was explained that the medicines he is receiving is the voices he was hearing & some some usual behaviors that his girl/friend wife observed him displaying prior to admission. Elijah Ryan argued that the voices he was hearing were that of his mother & that he is no longer hearing the voices any more. His Spanish interpreter privately informed this provider that when Elijah Ryan was offered some ensure this morning, he felt very suspicious that there was poison added to the ensure to keep him here. Elijah Ryan argued that he needs to be discharged to go home & get back to work to support his family. As this is the first psychiatric admission & treatments in this [facility], it is hard to know if this is patient's baseline.  However, he has made some progress since his admission in this [facility]. There are no behavioral problems noted expect when he was newly admitted & he declined to take his medication causing it to be given to IM. His Haldol was later changed to liquid to prevent him from cheeking his medications. There are no changes made on his current plan of care. Will continue as already in progress.  Principal Problem: Schizophrenia spectrum disorder with psychotic disorder type not yet determined (HCC)  Diagnosis: Principal Problem:   Schizophrenia spectrum disorder with psychotic disorder type not yet determined (HCC) Active Problems:   Delta-9-tetrahydrocannabinol (THC) dependence (HCC)   History of methamphetamine abuse (HCC)   Insomnia  Total Time spent with patient:  35 minutes  Past Psychiatric History: See H&P  Past Medical History: History reviewed. No pertinent past medical history. History reviewed. No pertinent surgical history.  Family History: History reviewed. No pertinent family history.  Family Psychiatric  History: See H&P.  Social History:  Social History   Substance and Sexual Activity  Alcohol Use Not Currently     Social History   Substance and Sexual Activity  Drug Use Not Currently    Social History   Socioeconomic History   Marital status: Single    Spouse name: Not on file   Number of children: Not on file   Years of education: Not on file   Highest education level: Not on file  Occupational History   Not on file  Tobacco Use   Smoking status: Never  Smokeless tobacco: Never  Vaping Use   Vaping Use: Not on file  Substance and Sexual Activity   Alcohol use: Not Currently   Drug use: Not Currently   Sexual activity: Not on file  Other Topics Concern   Not on file  Social History Narrative   Not on file   Social Determinants of Health   Financial Resource Strain: Not on file  Food Insecurity: No Food Insecurity (01/21/2023)   Hunger Vital Sign     Worried About Running Out of Food in the Last Year: Never true    Ran Out of Food in the Last Year: Never true  Transportation Needs: No Transportation Needs (01/21/2023)   PRAPARE - Administrator, Civil Service (Medical): No    Lack of Transportation (Non-Medical): No  Physical Activity: Not on file  Stress: Not on file  Social Connections: Not on file   Additional Social History:   Sleep: Good  Appetite:  Good  Current Medications: Current Facility-Administered Medications  Medication Dose Route Frequency Provider Last Rate Last Admin   acetaminophen (TYLENOL) tablet 650 mg  650 mg Oral Q6H PRN Dixon, Rashaun M, Elijah Ryan       alum & mag hydroxide-simeth (MAALOX/MYLANTA) 200-200-20 MG/5ML suspension 30 mL  30 mL Oral Q4H PRN Dixon, Rashaun M, Elijah Ryan       benztropine (COGENTIN) tablet 0.5 mg  0.5 mg Oral Q12H Massengill, Nathan, MD   0.5 mg at 01/26/23 0815   haloperidol (HALDOL) tablet 5 mg  5 mg Oral TID PRN Phineas Inches, MD       And   LORazepam (ATIVAN) tablet 2 mg  2 mg Oral TID PRN Phineas Inches, MD   2 mg at 01/23/23 1610   And   diphenhydrAMINE (BENADRYL) capsule 50 mg  50 mg Oral TID PRN Phineas Inches, MD   50 mg at 01/23/23 9604   haloperidol lactate (HALDOL) injection 5 mg  5 mg Intramuscular TID PRN Phineas Inches, MD   5 mg at 01/23/23 2004   And   LORazepam (ATIVAN) injection 2 mg  2 mg Intramuscular TID PRN Phineas Inches, MD   2 mg at 01/23/23 2004   And   diphenhydrAMINE (BENADRYL) injection 50 mg  50 mg Intramuscular TID PRN Phineas Inches, MD   50 mg at 01/23/23 2003   feeding supplement (ENSURE ENLIVE / ENSURE PLUS) liquid 237 mL  237 mL Oral BID BM Massengill, Harrold Donath, MD   237 mL at 01/23/23 1457   haloperidol (HALDOL) 2 MG/ML solution 10 mg  10 mg Oral Q12H Massengill, Harrold Donath, MD   10 mg at 01/26/23 0815   Or   haloperidol lactate (HALDOL) injection 5 mg  5 mg Intramuscular Q12H Massengill, Harrold Donath, MD       hydrOXYzine (ATARAX)  tablet 25 mg  25 mg Oral TID PRN Jearld Lesch, Elijah Ryan       magnesium hydroxide (MILK OF MAGNESIA) suspension 30 mL  30 mL Oral Daily PRN Jearld Lesch, Elijah Ryan       traZODone (DESYREL) tablet 50 mg  50 mg Oral QHS PRN Jearld Lesch, Elijah Ryan   50 mg at 01/25/23 1949   vitamin D3 (CHOLECALCIFEROL) tablet 1,000 Units  1,000 Units Oral Daily Phineas Inches, MD   1,000 Units at 01/26/23 0815   Lab Results:  No results found for this or any previous visit (from the past 48 hour(s)).  Blood Alcohol level:  Lab Results  Component Value Date  ETH <10 01/20/2023   Metabolic Disorder Labs: Lab Results  Component Value Date   HGBA1C 5.6 01/23/2023   MPG 114.02 01/23/2023   No results found for: "PROLACTIN" Lab Results  Component Value Date   CHOL 221 (H) 01/23/2023   TRIG 178 (H) 01/23/2023   HDL 38 (L) 01/23/2023   CHOLHDL 5.8 01/23/2023   VLDL 36 01/23/2023   LDLCALC 147 (H) 01/23/2023   Physical Findings: AIMS:  , ,  ,  ,    CIWA:    COWS:     Musculoskeletal: Strength & Muscle Tone: within normal limits Gait & Station: normal Patient leans: N/A  Psychiatric Specialty Exam:  Presentation  General Appearance:  Appropriate for Environment; Casual; Fairly Groomed  Eye Contact: Good  Speech: Clear and Coherent; Normal Rate  Speech Volume: Normal  Handedness: Right  Mood and Affect  Mood: -- (Improving)  Affect: Congruent  Thought Process  Thought Processes: Coherent; Goal Directed  Descriptions of Associations:Intact  Orientation:Full (Time, Place and Person)  Thought Content:Paranoid Ideation  History of Schizophrenia/Schizoaffective disorder:Yes  Duration of Psychotic Symptoms:Greater than six months  Hallucinations:Hallucinations: None Description of Auditory Hallucinations: NA Description of Visual Hallucinations: NA    Ideas of Reference:Paranoia  Suicidal Thoughts:Suicidal Thoughts: No    Homicidal Thoughts:Homicidal Thoughts:  No    Sensorium  Memory: Immediate Good; Recent Good; Remote Good  Judgment: Fair  Insight: Fair  Chartered certified accountant: Fair  Attention Span: Fair  Recall: [employer] of Knowledge: Fair  Language: Fair  Psychomotor Activity  Psychomotor Activity: Psychomotor Activity: Normal     Assets  Assets: Communication Skills; Desire for Improvement; Housing; Resilience; Physical Health; Social Support  Sleep  Sleep: Sleep: Good Number of Hours of Sleep: 8    Physical Exam: Physical Exam HENT:     Mouth/Throat:     Pharynx: Oropharynx is clear.  Eyes:     Pupils: Pupils are equal, round, and reactive to light.  Cardiovascular:     Rate and Rhythm: Normal rate.     Pulses: Normal pulses.  Pulmonary:     Effort: Pulmonary effort is normal.  Genitourinary:    Comments: Deferred Musculoskeletal:        General: Normal range of motion.     Cervical back: Normal range of motion.  Skin:    General: Skin is warm.  Neurological:     General: No focal deficit present.     Mental Status: He is alert and oriented to person, place, and time.    Review of Systems  Constitutional:  Negative for chills, diaphoresis and fever.  HENT:  Negative for congestion and sore throat.   Respiratory:  Negative for cough, shortness of breath and wheezing.   Cardiovascular:  Negative for chest pain and palpitations.   Blood pressure 103/83, pulse 85, temperature 98.7 F (37.1 C), temperature source Oral, resp. rate 18, height 5' 6.5" (1.689 m), weight 61.4 kg, SpO2 99 %. Body mass index is 21.53 kg/m.  Treatment Plan Summary: Daily contact with patient to assess and evaluate symptoms and progress in treatment and Medication management.   Continue inpatient hospitalization.  Will continue today 01/26/2023 plan as below except where it is noted.   Diagnoses Principal Problem:   Schizophrenia spectrum disorder with psychotic disorder type not yet determined  (HCC) Active Problems:   Delta-9-tetrahydrocannabinol (THC) dependence (HCC)   History of methamphetamine abuse (HCC)   Insomnia   Medications-FORCED MED ORDER IN PLACE- -Continue Haldol  solution 10  mg po bid for psychosis. Forced medication order in place, MUST give IM Haldol 5 mg if patient refuses Haldol 5 mg PO.    PRNS -Continue hydroxyzine 25 mg po tid prn for anxiety -Continue trazodone 50 mg nightly as needed for sleep.  -Continue benztropine 0.5 mg po bid for eps prevention.  -Continue Vit. D3 1,000 units po daily for bone health.  Agitation protocols: Cont as recommended;  -Benadryl 50 mg po or IM tid prn. -Haldol 5 mg po or IM tid prn.  -Lorazepam 2 mg po or IM tid prn.  -Continue Tylenol 650 mg every 6 hours PRN for mild pain -Continue Maalox 30 mg every 4 hrs PRN for indigestion -Continue Milk of Magnesia as needed every 6 hrs for constipation   Labs reviewed:  Lipid panel: Chol. 221, HDL 38-low, LDL 147 (H), Trigl 178 ((H). TSH, 2.801 HGBA1c, 5.6. Vitamin D, 20.33 (low).  CBC due to elevated WBCs of 10.6 (trending down).,   EKG with QTc within normal limits.   ANTIPSYCHOTIC CONSENT : We discussed the risks, benefits, side effects, and alternatives to THE PRESCRIBED ANTIPSYCHOTIC, including but not limited to, the risk of fatigue, sedation, metabolic syndrome, weight gain, movement abnormalities such as tremor & cogwheeling & tardive dyskinesia, temperature sensitivity, photosensitivity, blood pressure changes, heart rhythm effects, potential for medication interactions, and to not take these medications with alcohol or illicit drugs; informed consent was obtained. We discussed the necessity for routine monitoring including rating scales of abnormal movements, blood work, and ekgs, while the patient is prescribed antipsychotic medication.    Patient will make reeducation of information above as psychosis clears, as he currently did not comprehend this information in  current mental status.   Discharge Planning: Social work and case management to assist with discharge planning and identification of hospital follow-up needs prior to discharge Estimated LOS: 5-7 days Discharge Concerns: Need to establish a safety plan; Medication compliance and effectiveness Discharge Goals: Return home with outpatient referrals for mental health follow-up including medication management/psychotherapy   Elijah Stammer, Elijah Ryan, Elijah Ryan, Elijah Ryan. 01/26/2023, 4:23 PMPatient ID: Elijah Ryan, male   DOB: [birth year], 37 y.o.   MRN: [id number] Patient ID: Elijah Ryan, male   DOB: 1986/06/19, 37 y.o.   MRN: [id number] Patient ID: Elijah Ryan, male   DOB: 01-31-1986, 37 y.o.   MRN: [id number]

## 2023-01-26 NOTE — [facility] Group Notes (Signed)
Adult Psychoeducational Group Note  Date:  01/26/2023 Time:  8:36 PM  Group Topic/Focus:  Wrap-Up Group:   The focus of this group is to help patients review their daily goal of treatment and discuss progress on daily workbooks.  Participation Level:  Active  Participation Quality:  Appropriate  Affect:  Appropriate  Cognitive:  Appropriate  Insight: Appropriate  Engagement in Group:  Engaged  Modes of Intervention:  Discussion  Additional Comments:   Pt states that he had a good day and had a visit with his girlfriend that he enjoyed. Pt states that his general abilities are getting better and he is hopeful that he will be able to go home soon. Pt was encouraged to stay on his medication, seek outpatient therapies, learn and utilize coping skills, amongst other things in order to keep him functioning at his best. Pt denied everything.   Vevelyn Pat 01/26/2023, 8:36 PM

## 2023-01-26 NOTE — Progress Notes (Signed)
D- Assessment performed via Advertising account executive, Kandis Mannan [phone]). Patient alert and oriented.  Denies SI, HI, AVH, and pain. Reports having a "good day."  A- Scheduled medications administered to patient along with PRN trazodone, per MAR. Support and encouragement provided.  Routine safety checks conducted every 15 minutes.  Patient informed to notify staff with problems or concerns.  R- No adverse drug reactions noted. Patient contracts for safety at this time. Patient compliant with medications and treatment plan. Patient receptive, calm, and cooperative. Patient interacts well with others on the unit.  Patient remains safe at this time.

## 2023-01-26 NOTE — Progress Notes (Signed)
   01/26/23 0900  Psych Admission Type (Psych Patients Only)  Admission Status Involuntary  Psychosocial Assessment  Patient Complaints Suspiciousness  Eye Contact Fair  Facial Expression Sad  Affect Appropriate to circumstance  Speech Logical/coherent  Interaction Cautious;Isolative  Motor Activity Slow  Appearance/Hygiene Unremarkable  Behavior Characteristics Cooperative  Mood Suspicious;Preoccupied  Thought Process  Coherency Circumstantial  Content Preoccupation  Delusions WDL  Perception WDL  Hallucination None reported or observed  Judgment Impaired  Confusion None  Danger to Self  Current suicidal ideation? Denies  Agreement Not to Harm Self Yes  Description of Agreement verbal  Danger to Others  Danger to Others None reported or observed

## 2023-01-27 DIAGNOSIS — F29 Unspecified psychosis not due to a substance or known physiological condition: Secondary | ICD-10-CM | POA: Diagnosis not present

## 2023-01-27 NOTE — Progress Notes (Signed)
   01/26/23 1955  Psych Admission Type (Psych Patients Only)  Admission Status Involuntary  Psychosocial Assessment  Patient Complaints Suspiciousness  Eye Contact Fair  Facial Expression Flat  Affect Appropriate to circumstance  Speech Logical/coherent  Interaction Cautious;Minimal  Motor Activity Other (Comment) (WDL)  Appearance/Hygiene Unremarkable  Behavior Characteristics Cooperative  Mood Suspicious  Thought Process  Coherency Circumstantial  Content Preoccupation  Delusions WDL  Perception WDL  Hallucination None reported or observed  Judgment Impaired  Confusion None  Danger to Self  Current suicidal ideation? Denies  Agreement Not to Harm Self Yes  Description of Agreement Verbal  Danger to Others  Danger to Others None reported or observed

## 2023-01-27 NOTE — Plan of Care (Signed)
  Problem: Education: Goal: Emotional status will improve Outcome: Progressing Goal: Mental status will improve Outcome: Progressing   Problem: Health Behavior/Discharge Planning: Goal: Compliance with treatment plan for underlying cause of condition will improve Outcome: Progressing   

## 2023-01-27 NOTE — Group Note (Signed)
Date:  01/27/2023 Time:  9:04 PM  Group Topic/Focus:  Wrap-Up Group:   The focus of this group is to help patients review their daily goal of treatment and discuss progress on daily workbooks.    Participation Level:  Active  Participation Quality:  Appropriate  Affect:  Appropriate  Cognitive:  Appropriate  Insight: Appropriate  Engagement in Group:  Engaged  Modes of Intervention:  Education and Exploration  Additional Comments:  Patient attended and participated in group tonight. He reports that he esperience nothing special today.  Lita Mains [facility] 01/27/2023, 9:04 PM

## 2023-01-27 NOTE — Progress Notes (Signed)
   01/27/23 0554  15 Minute Checks  Location Bedroom  Visual Appearance Calm  Behavior Sleeping  Sleep (Behavioral Health Patients Only)  Calculate sleep? (Click Yes once per 24 hr at 0600 safety check) Yes  Documented sleep last 24 hours 8.5

## 2023-01-27 NOTE — Progress Notes (Signed)
[facility] MD Progress Note  01/27/2023 4:15 PM Elijah Ryan  MRN:  [id number]  Reason for admission:  37 yo Hispanic male with an unknown mental health history, who presented to the [facility] @ [place] on 6/09 under IVC taken out by his family members for manic type symptoms and psychosis; As per documentation from the ER, patient was manic, pacing, seeing devils, saying that he is God, and stating that he would take over the world etc. Patient was transferred on 6/10 by law enforcement to this behavioral health Hospital for treatment and stabilization of his mental status.   Daily notes: (This follow-up evaluation was conducted using a Hispanic interpreter from the language resource): Elijah Ryan is seen in his room. Chart reviewed. The chart findings discussed with the treatment team. He was sitting in the day room with the other patients & his Spanish interpreter. He presents with a good  affect, good eye contact & verbally responsive. He is eagerly anticipating to be told when he is going to be discharged. In all accounts including mannerisms & behaviors, patient may be reaching or at least at his baseline. He reports, "I'm doing well, thank God. I have no symptoms of depression or anxiety. I'm sleeping well. The only thing is, the medicines are making me sleepy & tired. But I'm okay. I have to tell you that I'm worried about my job & my family. I need to be discharged. I'm ready to go home". Elijah Ryan denies any SIHI, AVH, delusional thoughts or paranoia. He does not appear to be responding to any internal stimuli. There are no changes made on his current plan of care. Will continue as already in progress. Patient's wife visited this past Friday, felt patient is approaching his baseline.  Principal Problem: Schizophrenia spectrum disorder with psychotic disorder type not yet determined (HCC)  Diagnosis: Principal Problem:   Schizophrenia spectrum disorder with psychotic disorder type not yet  determined (HCC) Active Problems:   Delta-9-tetrahydrocannabinol (THC) dependence (HCC)   History of methamphetamine abuse (HCC)   Insomnia  Total Time spent with patient:  35 minutes  Past Psychiatric History: See H&P  Past Medical History: History reviewed. No pertinent past medical history. History reviewed. No pertinent surgical history.  Family History: History reviewed. No pertinent family history.  Family Psychiatric  History: See H&P.  Social History:  Social History   Substance and Sexual Activity  Alcohol Use Not Currently     Social History   Substance and Sexual Activity  Drug Use Not Currently    Social History   Socioeconomic History   Marital status: Single    Spouse name: Not on file   Number of children: Not on file   Years of education: Not on file   Highest education level: Not on file  Occupational History   Not on file  Tobacco Use   Smoking status: Never   Smokeless tobacco: Never  Vaping Use   Vaping Use: Not on file  Substance and Sexual Activity   Alcohol use: Not Currently   Drug use: Not Currently   Sexual activity: Not on file  Other Topics Concern   Not on file  Social History Narrative   Not on file   Social Determinants of Health   Financial Resource Strain: Not on file  Food Insecurity: No Food Insecurity (01/21/2023)   Hunger Vital Sign    Worried About Running Out of Food in the Last Year: Never true    Ran Out of  Food in the Last Year: Never true  Transportation Needs: No Transportation Needs (01/21/2023)   PRAPARE - Administrator, Civil Service (Medical): No    Lack of Transportation (Non-Medical): No  Physical Activity: Not on file  Stress: Not on file  Social Connections: Not on file   Additional Social History:   Sleep: Good  Appetite:  Good  Current Medications: Current Facility-Administered Medications  Medication Dose Route Frequency Provider Last Rate Last Admin   acetaminophen (TYLENOL)  tablet 650 mg  650 mg Oral Q6H PRN Dixon, Rashaun M, NP       alum & mag hydroxide-simeth (MAALOX/MYLANTA) 200-200-20 MG/5ML suspension 30 mL  30 mL Oral Q4H PRN Dixon, Rashaun M, NP       benztropine (COGENTIN) tablet 0.5 mg  0.5 mg Oral Q12H Massengill, Nathan, MD   0.5 mg at 01/27/23 0818   haloperidol (HALDOL) tablet 5 mg  5 mg Oral TID PRN Phineas Inches, MD       And   LORazepam (ATIVAN) tablet 2 mg  2 mg Oral TID PRN Phineas Inches, MD   2 mg at 01/23/23 4098   And   diphenhydrAMINE (BENADRYL) capsule 50 mg  50 mg Oral TID PRN Phineas Inches, MD   50 mg at 01/23/23 1191   haloperidol lactate (HALDOL) injection 5 mg  5 mg Intramuscular TID PRN Phineas Inches, MD   5 mg at 01/23/23 2004   And   LORazepam (ATIVAN) injection 2 mg  2 mg Intramuscular TID PRN Phineas Inches, MD   2 mg at 01/23/23 2004   And   diphenhydrAMINE (BENADRYL) injection 50 mg  50 mg Intramuscular TID PRN Phineas Inches, MD   50 mg at 01/23/23 2003   feeding supplement (ENSURE ENLIVE / ENSURE PLUS) liquid 237 mL  237 mL Oral BID BM Massengill, Harrold Donath, MD   237 mL at 01/23/23 1457   haloperidol (HALDOL) 2 MG/ML solution 10 mg  10 mg Oral Q12H Massengill, Harrold Donath, MD   10 mg at 01/27/23 0818   Or   haloperidol lactate (HALDOL) injection 5 mg  5 mg Intramuscular Q12H Massengill, Harrold Donath, MD       hydrOXYzine (ATARAX) tablet 25 mg  25 mg Oral TID PRN Jearld Lesch, NP       magnesium hydroxide (MILK OF MAGNESIA) suspension 30 mL  30 mL Oral Daily PRN Jearld Lesch, NP       traZODone (DESYREL) tablet 50 mg  50 mg Oral QHS PRN Jearld Lesch, NP   50 mg at 01/26/23 2047   vitamin D3 (CHOLECALCIFEROL) tablet 1,000 Units  1,000 Units Oral Daily Phineas Inches, MD   1,000 Units at 01/27/23 0818   Lab Results:  No results found for this or any previous visit (from the past 48 hour(s)).  Blood Alcohol level:  Lab Results  Component Value Date   ETH <10 01/20/2023   Metabolic Disorder  Labs: Lab Results  Component Value Date   HGBA1C 5.6 01/23/2023   MPG 114.02 01/23/2023   No results found for: "PROLACTIN" Lab Results  Component Value Date   CHOL 221 (H) 01/23/2023   TRIG 178 (H) 01/23/2023   HDL 38 (L) 01/23/2023   CHOLHDL 5.8 01/23/2023   VLDL 36 01/23/2023   LDLCALC 147 (H) 01/23/2023   Physical Findings: AIMS:  , ,  ,  ,    CIWA:    COWS:     Musculoskeletal: Strength & Muscle Tone: within  normal limits Gait & Station: normal Patient leans: N/A  Psychiatric Specialty Exam:  Presentation  General Appearance:  Appropriate for Environment; Casual; Fairly Groomed  Eye Contact: Good  Speech: Clear and Coherent; Normal Rate  Speech Volume: Normal  Handedness: Right  Mood and Affect  Mood: -- (Improving)  Affect: Congruent  Thought Process  Thought Processes: Coherent; Goal Directed  Descriptions of Associations:Intact  Orientation:Full (Time, Place and Person)  Thought Content:Paranoid Ideation  History of Schizophrenia/Schizoaffective disorder:Yes  Duration of Psychotic Symptoms:Greater than six months  Hallucinations:Hallucinations: None Description of Auditory Hallucinations: NA Description of Visual Hallucinations: NA   Ideas of Reference:Paranoia  Suicidal Thoughts:Suicidal Thoughts: No   Homicidal Thoughts:Homicidal Thoughts: No   Sensorium  Memory: Immediate Good; Recent Good; Remote Good  Judgment: Fair  Insight: Fair  Chartered certified accountant: Fair  Attention Span: Fair  Recall: [employer] of Knowledge: Fair  Language: Fair  Psychomotor Activity  Psychomotor Activity: Psychomotor Activity: Normal    Assets  Assets: Communication Skills; Desire for Improvement; Housing; Resilience; Physical Health; Social Support  Sleep  Sleep: Sleep: Good Number of Hours of Sleep: 8   Physical Exam: Physical Exam Vitals and nursing note reviewed.  HENT:     Mouth/Throat:      Pharynx: Oropharynx is clear.  Eyes:     Pupils: Pupils are equal, round, and reactive to light.  Cardiovascular:     Rate and Rhythm: Normal rate.     Pulses: Normal pulses.  Pulmonary:     Effort: Pulmonary effort is normal.  Genitourinary:    Comments: Deferred Musculoskeletal:        General: Normal range of motion.     Cervical back: Normal range of motion.  Skin:    General: Skin is warm.  Neurological:     General: No focal deficit present.     Mental Status: He is alert and oriented to person, place, and time.    Review of Systems  Constitutional:  Negative for chills, diaphoresis and fever.  HENT:  Negative for congestion and sore throat.   Respiratory:  Negative for cough, shortness of breath and wheezing.   Cardiovascular:  Negative for chest pain and palpitations.   Blood pressure 101/71, pulse 87, temperature 97.8 F (36.6 C), temperature source Oral, resp. rate 14, height 5' 6.5" (1.689 m), weight 61.4 kg, SpO2 99 %. Body mass index is 21.53 kg/m.  Treatment Plan Summary: Daily contact with patient to assess and evaluate symptoms and progress in treatment and Medication management.   Continue inpatient hospitalization.  Will continue today 01/27/2023 plan as below except where it is noted.   Diagnoses Principal Problem:   Schizophrenia spectrum disorder with psychotic disorder type not yet determined (HCC) Active Problems:   Delta-9-tetrahydrocannabinol (THC) dependence (HCC)   History of methamphetamine abuse (HCC)   Insomnia   Medications-FORCED MED ORDER IN PLACE- -Continue Haldol  solution 10 mg po bid for psychosis.  -Forced medication order in place, MUST give IM Haldol 5 mg if patient refuses Haldol 5 mg PO.    PRNS -Continue hydroxyzine 25 mg po tid prn for anxiety -Continue trazodone 50 mg nightly as needed for sleep.  -Continue benztropine 0.5 mg po bid for eps prevention.  -Continue Vit. D3 1,000 units po daily for bone health.  Agitation  protocols: Cont as recommended;  -Benadryl 50 mg po or IM tid prn. -Haldol 5 mg po or IM tid prn.  -Lorazepam 2 mg po or IM tid prn.  -  Continue Tylenol 650 mg every 6 hours PRN for mild pain -Continue Maalox 30 mg every 4 hrs PRN for indigestion -Continue Milk of Magnesia as needed every 6 hrs for constipation   Labs reviewed:  Lipid panel: Chol. 221, HDL 38-low, LDL 147 (H), Trigl 178 ((H). TSH, 2.801 HGBA1c, 5.6. Vitamin D, 20.33 (low).  CBC due to elevated WBCs of 10.6 (trending down).,   EKG with QTc within normal limits.   ANTIPSYCHOTIC CONSENT : We discussed the risks, benefits, side effects, and alternatives to THE PRESCRIBED ANTIPSYCHOTIC, including but not limited to, the risk of fatigue, sedation, metabolic syndrome, weight gain, movement abnormalities such as tremor & cogwheeling & tardive dyskinesia, temperature sensitivity, photosensitivity, blood pressure changes, heart rhythm effects, potential for medication interactions, and to not take these medications with alcohol or illicit drugs; informed consent was obtained. We discussed the necessity for routine monitoring including rating scales of abnormal movements, blood work, and ekgs, while the patient is prescribed antipsychotic medication.    Patient will make reeducation of information above as psychosis clears, as he currently did not comprehend this information in current mental status.   Discharge Planning: Social work and case management to assist with discharge planning and identification of hospital follow-up needs prior to discharge Estimated LOS: 5-7 days Discharge Concerns: Need to establish a safety plan; Medication compliance and effectiveness Discharge Goals: Return home with outpatient referrals for mental health follow-up including medication management/psychotherapy   Armandina Stammer, NP, pmhnp, fnp-bc. 01/27/2023, 4:15 PMPatient ID: Tildon Husky, male   DOB: 01/08/1986, 37 y.o.   MRN:  [id number] Patient ID: [facility] Najm Cartner, male   DOB: 1986/06/10, 37 y.o.   MRN: [id number] Patient ID: [facility] Trashon Pownall, male   DOB: [birth year], 38 y.o.   MRN: [id number]

## 2023-01-27 NOTE — [facility] Group Notes (Signed)
[facility] Group Notes:  (Nursing/MHT/Case Management/Adjunct)  Date:  01/27/2023  Time: 1000  Type of Therapy:  Psychoeducational Skills  Participation Level:  active  Participation Quality:  appropriate  Affect:  appropriate  Cognitive: appropriate  Insight:  engaged  Engagement in Group:  engaged, supportive  Modes of Intervention:  Discussion, Education, and Exploration  Summary of Progress/Problems: Patients were given two poems to read, one titled ''watch your thoughts for they become your actions'' and another '' the owl and the chimpanzee'' by Jacquelyne Balint. Patients were given education on positive reframing and asked to identify one negative belief about themselves they would like to heal from to impact positive mental well being. Discussion, education and education of healthy coping skills were also discussed in group. Pt shared and was appropriate with the help of interpreter.

## 2023-01-28 ENCOUNTER — Encounter (HOSPITAL_COMMUNITY): Payer: Self-pay

## 2023-01-28 DIAGNOSIS — F29 Unspecified psychosis not due to a substance or known physiological condition: Secondary | ICD-10-CM | POA: Diagnosis not present

## 2023-01-28 NOTE — Progress Notes (Signed)
   01/28/23 2000  Psych Admission Type (Psych Patients Only)  Admission Status Involuntary  Psychosocial Assessment  Patient Complaints None  Eye Contact Fair  Facial Expression Animated  Affect Appropriate to circumstance  Speech Logical/coherent  Interaction Minimal  Motor Activity Slow  Appearance/Hygiene Unremarkable  Behavior Characteristics Cooperative  Mood Pleasant  Thought Process  Coherency Circumstantial  Content Preoccupation  Delusions None reported or observed  Perception WDL  Hallucination None reported or observed  Judgment Impaired  Confusion None  Danger to Self  Current suicidal ideation? Denies  Agreement Not to Harm Self Yes  Description of Agreement verbal  Danger to Others  Danger to Others None reported or observed   Alert/oriented. Makes needs/concerns known to staff. Pleasant cooperative with staff. Denies SI/HI/A/V hallucinations.  Patient states went to group.  Will encourage  compliance and progression towards goals. Verbally contracted for safety. Will continue to monitor.

## 2023-01-28 NOTE — Progress Notes (Signed)
   01/28/23 0615  15 Minute Checks  Location Bedroom  Visual Appearance Calm  Behavior Composed  Sleep (Behavioral Health Patients Only)  Calculate sleep? (Click Yes once per 24 hr at 0600 safety check) Yes  Documented sleep last 24 hours 8.5

## 2023-01-28 NOTE — Plan of Care (Signed)
  Problem: Safety: Goal: Periods of time without injury will increase Outcome: Progressing   

## 2023-01-28 NOTE — Progress Notes (Signed)
[facility] MD Progress Note  01/28/2023 5:46 PM Elijah Ryan  MRN:  [id number]  Reason for admission:  37 yo Hispanic male with an unknown mental health history, who presented to the [facility] @ [place] on 6/09 under IVC taken out by his family members for manic type symptoms and psychosis; As per documentation from the ER, patient was manic, pacing, seeing devils, saying that he is God, and stating that he would take over the world etc. Patient was transferred on 6/10 by law enforcement to this behavioral health Hospital for treatment and stabilization of his mental status.   Yesterday the psychiatry team made the following recommendations: -Continue Haldol  solution 10 mg po bid for psychosis.  -Forced medication order in place, MUST give IM Haldol 5 mg if patient refuses Haldol 5 mg PO.    This follow-up evaluation was conducted using a Hispanic interpreter from the language resource: On assessment today, the pt reports that their mood is euthymic, reports depression as numbered 0/10, with 10 being highest severity Reports that anxiety is disturbed by 0/10, with 10 being highest severity Sleep sleeping over 8 hours last night and being restful Reports appetite is very good Good concentration during this evaluation.  Patient able to ask appropriate questions.  Energy level is is improved Denies suicidal thoughts. Denies having any HI.  Denies having psychotic symptoms.   Denies having side effects to current psychiatric medications.   We discussed compliance to current medication regimen.  Discussed the following psychosocial stressors: To attend therapeutic milieu and group activities; continue to take his scheduled medication even after discharge; and cessation of polysubstance usage as they adversely affect overall psychiatric and medical wellbeing.  Daily notes: (This follow-up evaluation was conducted using a Hispanic interpreter from the language resource): Elijah Ryan is seen  in his room. Chart reviewed. The chart findings discussed with the treatment team. He was sitting in the day room with the other patients & his Spanish interpreter. He presents with a good  affect, good eye contact & verbally responsive. He is eagerly anticipating to be told when he is going to be discharged. In all accounts including mannerisms & behaviors, patient may be reaching or at least at his baseline. He reports, "I'm doing well, thank God. I have no symptoms of depression or anxiety. I'm sleeping well. The only thing is, the medicines are making me sleepy & tired. But I'm okay. I have to tell you that I'm worried about my job & my family. I need to be discharged. I'm ready to go home". Elijah Ryan denies any SIHI, AVH, delusional thoughts or paranoia. He does not appear to be responding to any internal stimuli. There are no changes made on his current plan of care. Will continue as already in progress. Patient's wife visited this past Friday, felt patient is approaching his baseline.  Principal Problem: Schizophrenia spectrum disorder with psychotic disorder type not yet determined (HCC)  Diagnosis: Principal Problem:   Schizophrenia spectrum disorder with psychotic disorder type not yet determined (HCC) Active Problems:   Delta-9-tetrahydrocannabinol (THC) dependence (HCC)   History of methamphetamine abuse (HCC)   Insomnia  Total Time spent with patient:  35 minutes  Past Psychiatric History: See H&P  Past Medical History: History reviewed. No pertinent past medical history. History reviewed. No pertinent surgical history.  Family History: History reviewed. No pertinent family history.  Family Psychiatric  History: See H&P.  Social History:  Social History   Substance and Sexual Activity  Alcohol Use Not Currently     Social History   Substance and Sexual Activity  Drug Use Not Currently    Social History   Socioeconomic History   Marital status: Single    Spouse name: Not  on file   Number of children: Not on file   Years of education: Not on file   Highest education level: Not on file  Occupational History   Not on file  Tobacco Use   Smoking status: Never   Smokeless tobacco: Never  Vaping Use   Vaping Use: Not on file  Substance and Sexual Activity   Alcohol use: Not Currently   Drug use: Not Currently   Sexual activity: Not on file  Other Topics Concern   Not on file  Social History Narrative   Not on file   Social Determinants of Health   Financial Resource Strain: Not on file  Food Insecurity: No Food Insecurity (01/21/2023)   Hunger Vital Sign    Worried About Running Out of Food in the Last Year: Never true    Ran Out of Food in the Last Year: Never true  Transportation Needs: No Transportation Needs (01/21/2023)   PRAPARE - Administrator, Civil Service (Medical): No    Lack of Transportation (Non-Medical): No  Physical Activity: Not on file  Stress: Not on file  Social Connections: Not on file   Additional Social History:   Sleep: Good  Appetite:  Good  Current Medications: Current Facility-Administered Medications  Medication Dose Route Frequency Provider Last Rate Last Admin   acetaminophen (TYLENOL) tablet 650 mg  650 mg Oral Q6H PRN Dixon, Rashaun M, NP       alum & mag hydroxide-simeth (MAALOX/MYLANTA) 200-200-20 MG/5ML suspension 30 mL  30 mL Oral Q4H PRN Dixon, Rashaun M, NP       benztropine (COGENTIN) tablet 0.5 mg  0.5 mg Oral Q12H Massengill, Nathan, MD   0.5 mg at 01/28/23 0759   haloperidol (HALDOL) tablet 5 mg  5 mg Oral TID PRN Phineas Inches, MD       And   LORazepam (ATIVAN) tablet 2 mg  2 mg Oral TID PRN Phineas Inches, MD   2 mg at 01/23/23 1610   And   diphenhydrAMINE (BENADRYL) capsule 50 mg  50 mg Oral TID PRN Phineas Inches, MD   50 mg at 01/23/23 9604   haloperidol lactate (HALDOL) injection 5 mg  5 mg Intramuscular TID PRN Phineas Inches, MD   5 mg at 01/23/23 2004   And    LORazepam (ATIVAN) injection 2 mg  2 mg Intramuscular TID PRN Phineas Inches, MD   2 mg at 01/23/23 2004   And   diphenhydrAMINE (BENADRYL) injection 50 mg  50 mg Intramuscular TID PRN Phineas Inches, MD   50 mg at 01/23/23 2003   feeding supplement (ENSURE ENLIVE / ENSURE PLUS) liquid 237 mL  237 mL Oral BID BM Massengill, Harrold Donath, MD   237 mL at 01/28/23 1441   haloperidol (HALDOL) 2 MG/ML solution 10 mg  10 mg Oral Q12H Massengill, Harrold Donath, MD   10 mg at 01/28/23 5409   Or   haloperidol lactate (HALDOL) injection 5 mg  5 mg Intramuscular Q12H Massengill, Harrold Donath, MD       hydrOXYzine (ATARAX) tablet 25 mg  25 mg Oral TID PRN Jearld Lesch, NP       magnesium hydroxide (MILK OF MAGNESIA) suspension 30 mL  30 mL Oral Daily PRN Dixon,  Elray Buba, NP       traZODone (DESYREL) tablet 50 mg  50 mg Oral QHS PRN Jearld Lesch, NP   50 mg at 01/27/23 2117   vitamin D3 (CHOLECALCIFEROL) tablet 1,000 Units  1,000 Units Oral Daily Phineas Inches, MD   1,000 Units at 01/28/23 0759   Lab Results:  No results found for this or any previous visit (from the past 48 hour(s)).  Blood Alcohol level:  Lab Results  Component Value Date   ETH <10 01/20/2023   Metabolic Disorder Labs: Lab Results  Component Value Date   HGBA1C 5.6 01/23/2023   MPG 114.02 01/23/2023   No results found for: "PROLACTIN" Lab Results  Component Value Date   CHOL 221 (H) 01/23/2023   TRIG 178 (H) 01/23/2023   HDL 38 (L) 01/23/2023   CHOLHDL 5.8 01/23/2023   VLDL 36 01/23/2023   LDLCALC 147 (H) 01/23/2023   Physical Findings: AIMS:  , ,  ,  ,    CIWA:    COWS:     Musculoskeletal: Strength & Muscle Tone: within normal limits Gait & Station: normal Patient leans: N/A  Psychiatric Specialty Exam:  Presentation  General Appearance:  Appropriate for Environment; Casual  Eye Contact: Good  Speech: Clear and Coherent  Speech Volume: Normal  Handedness: Right  Mood and Affect   Mood: Euthymic  Affect: Congruent  Thought Process  Thought Processes: Coherent; Goal Directed  Descriptions of Associations:Intact  Orientation:Full (Time, Place and Person)  Thought Content:Paranoid Ideation  History of Schizophrenia/Schizoaffective disorder:Yes  Duration of Psychotic Symptoms:Greater than six months  Hallucinations:Hallucinations: None Description of Auditory Hallucinations: Denies Description of Visual Hallucinations: Denies  Ideas of Reference:Paranoia  Suicidal Thoughts:Suicidal Thoughts: No   Homicidal Thoughts:Homicidal Thoughts: No   Sensorium  Memory: Recent Good  Judgment: Fair  Insight: Fair  Chartered certified accountant: Fair  Attention Span: Fair  Recall: [employer] of Knowledge: Fair  Language: Fair  Psychomotor Activity  Psychomotor Activity: Psychomotor Activity: Normal  Assets  Assets: Communication Skills; Desire for Improvement; Physical Health; Resilience  Sleep  Sleep: Sleep: Good Number of Hours of Sleep: 8  Physical Exam: Physical Exam Vitals and nursing note reviewed.  HENT:     Mouth/Throat:     Pharynx: Oropharynx is clear.  Eyes:     Pupils: Pupils are equal, round, and reactive to light.  Cardiovascular:     Rate and Rhythm: Normal rate.     Pulses: Normal pulses.  Pulmonary:     Effort: Pulmonary effort is normal.  Genitourinary:    Comments: Deferred Musculoskeletal:        General: Normal range of motion.     Cervical back: Normal range of motion.  Skin:    General: Skin is warm.  Neurological:     General: No focal deficit present.     Mental Status: He is alert and oriented to person, place, and time.  Psychiatric:        Behavior: Behavior normal.    Review of Systems  Constitutional:  Negative for chills, diaphoresis and fever.  HENT:  Negative for congestion and sore throat.   Respiratory:  Negative for cough, shortness of breath and wheezing.    Cardiovascular:  Negative for chest pain and palpitations.   Blood pressure 112/70, pulse 87, temperature 98 F (36.7 C), temperature source Oral, resp. rate 17, height 5' 6.5" (1.689 m), weight 61.4 kg, SpO2 100 %. Body mass index is 21.53 kg/m.  Treatment Plan  Summary: Daily contact with patient to assess and evaluate symptoms and progress in treatment and Medication management.   Continue inpatient hospitalization.  Will continue today 01/28/2023 plan as below except where it is noted.   Diagnoses Principal Problem:   Schizophrenia spectrum disorder with psychotic disorder type not yet determined (HCC) Active Problems:   Delta-9-tetrahydrocannabinol (THC) dependence (HCC)   History of methamphetamine abuse (HCC)   Insomnia   Medications-FORCED MED ORDER IN PLACE- -Continue Haldol  solution 10 mg po bid for psychosis.  -Forced medication order in place, MUST give IM Haldol 5 mg if patient refuses Haldol 5 mg PO.    PRNS -Continue hydroxyzine 25 mg po tid prn for anxiety -Continue trazodone 50 mg nightly as needed for sleep.  -Continue benztropine 0.5 mg po bid for eps prevention.  -Continue Vit. D3 1,000 units po daily for bone health.  Agitation protocols: Cont as recommended;  -Benadryl 50 mg po or IM tid prn. -Haldol 5 mg po or IM tid prn.  -Lorazepam 2 mg po or IM tid prn.  -Continue Tylenol 650 mg every 6 hours PRN for mild pain -Continue Maalox 30 mg every 4 hrs PRN for indigestion -Continue Milk of Magnesia as needed every 6 hrs for constipation   Labs reviewed:  Lipid panel: Chol. 221, HDL 38-low, LDL 147 (H), Trigl 178 ((H). TSH, 2.801 HGBA1c, 5.6. Vitamin D, 20.33 (low).  CBC due to elevated WBCs of 10.6 (trending down).,   EKG with QTc within normal limits.   ANTIPSYCHOTIC CONSENT : We discussed the risks, benefits, side effects, and alternatives to THE PRESCRIBED ANTIPSYCHOTIC, including but not limited to, the risk of fatigue, sedation, metabolic  syndrome, weight gain, movement abnormalities such as tremor & cogwheeling & tardive dyskinesia, temperature sensitivity, photosensitivity, blood pressure changes, heart rhythm effects, potential for medication interactions, and to not take these medications with alcohol or illicit drugs; informed consent was obtained. We discussed the necessity for routine monitoring including rating scales of abnormal movements, blood work, and ekgs, while the patient is prescribed antipsychotic medication.    Patient will make reeducation of information above as psychosis clears, as he currently did not comprehend this information in current mental status.   Discharge Planning: Social work and case management to assist with discharge planning and identification of hospital follow-up needs prior to discharge Estimated LOS: 5-7 days Discharge Concerns: Need to establish a safety plan; Medication compliance and effectiveness Discharge Goals: Return home with outpatient referrals for mental health follow-up including medication management/psychotherapy   Cecilie Lowers, FNP, pmhnp, fnp-bc. 01/28/2023, 5:46 PMPatient ID: Elijah Ryan, male   DOB: [birth year], 37 y.o.   MRN: [id number] Patient ID: Elijah Ryan, male   DOB: 08-02-1986, 37 y.o.   MRN: [id number] Patient ID: Elijah Ryan, male   DOB: Nov 01, 1985, 37 y.o.   MRN: [id number] Patient ID: Elijah Ryan, male   DOB: 1985/11/09, 37 y.o.   MRN: [id number]

## 2023-01-28 NOTE — Progress Notes (Signed)
   01/28/23 1107  Psych Admission Type (Psych Patients Only)  Admission Status Involuntary  Psychosocial Assessment  Patient Complaints None  Eye Contact Fair  Facial Expression Flat  Affect Appropriate to circumstance  Speech Logical/coherent  Interaction Minimal  Motor Activity Slow  Appearance/Hygiene Unremarkable  Behavior Characteristics Cooperative;Appropriate to situation  Mood Pleasant  Thought Process  Coherency Circumstantial  Content Preoccupation  Delusions None reported or observed  Perception WDL  Hallucination None reported or observed  Judgment Impaired  Confusion None  Danger to Self  Current suicidal ideation? Denies  Agreement Not to Harm Self Yes  Description of Agreement Verbal  Danger to Others  Danger to Others None reported or observed

## 2023-01-28 NOTE — Plan of Care (Signed)
  Problem: Education: Goal: Emotional status will improve Outcome: Progressing   Problem: Education: Goal: Verbalization of understanding the information provided will improve Outcome: Progressing   Problem: Activity: Goal: Sleeping patterns will improve Outcome: Progressing   Problem: Safety: Goal: Periods of time without injury will increase Outcome: Progressing   Problem: Education: Goal: Will be free of psychotic symptoms Outcome: Progressing

## 2023-01-28 NOTE — Progress Notes (Signed)
[facility] LCSW Progress Note   01/28/2023    1:48 PM  Shanard Maurico Taeden Fistler   [id number]   Type of Contact and Topic:  Discharge and Collateral Contact  CSW spoke with patient partner, [name], who states that patient is doing a lot better and agrees with plan to be discharged tomorrow. She will be here around 10:30am. She requested for some information about immigration resources for the patient as well.  CSW agreed to put information in his discharge paperwork.    Signed:  Anson [place] MSW, LCSW, LCAS 01/28/2023 1:48 PM

## 2023-01-28 NOTE — Progress Notes (Signed)
   01/27/23 2130  Psych Admission Type (Psych Patients Only)  Admission Status Involuntary  Psychosocial Assessment  Patient Complaints Suspiciousness  Eye Contact Fair  Facial Expression Flat  Affect Appropriate to circumstance  Speech Logical/coherent  Interaction Cautious;Minimal  Motor Activity Slow  Appearance/Hygiene In hospital gown  Behavior Characteristics Cooperative  Mood Suspicious  Thought Process  Coherency Circumstantial  Content Preoccupation  Delusions WDL  Perception WDL  Hallucination None reported or observed  Judgment Impaired  Confusion None  Danger to Self  Current suicidal ideation? Denies  Agreement Not to Harm Self Yes  Description of Agreement Verbal  Danger to Others  Danger to Others None reported or observed

## 2023-01-28 NOTE — Group Note (Signed)
Recreation Therapy Group Note   Group Topic:Coping Skills  Group Date: 01/28/2023 Start Time: 1015 End Time: 1025 Facilitators: Hazelyn Kallen-McCall, LRT,CTRS Location: [address] Dayroom   Goal Area(s) Addresses: Patient will define what a coping skill is. Patient will work with  to create a list of healthy coping skills beginning with each letter of the alphabet. Patient will successfully identify positive coping skills they can use post d/c.  Patient will acknowledge benefit(s) of using learned coping skills post d/c.   Group Description: Coping A to Z. Patient asked to identify what a coping skill is and when they use them. Patients with Clinical research associate discussed healthy versus unhealthy coping skills. Next patients were given a blank worksheet titled "Coping Skills A-Z".  Patients were instructed to come up with at least one positive coping skill per letter of the alphabet, addressing a specific challenge (ex: stress, anger, anxiety, depression, grief, doubt, isolation, self-harm/suicidal thoughts, substance use). Patients were given 15 minutes to brainstorm before ideas were presented to the large group. Patients and LRT debriefed on the importance of coping skill selection based on situation and back-up plans when a skill tried is not effective. At the end of group, patients were given an handout of alphabetized strategies to keep for future reference.   Affect/Mood: N/A   Participation Level: Did not attend    Clinical Observations/Individualized Feedback:     Plan: Continue to engage patient in RT group sessions 2-3x/week.   Shatia Sindoni-McCall, LRT,CTRS  01/28/2023 1:05 PM

## 2023-01-28 NOTE — Plan of Care (Signed)
°  Problem: Education: °Goal: Emotional status will improve °Outcome: Progressing °Goal: Mental status will improve °Outcome: Progressing °Goal: Verbalization of understanding the information provided will improve °Outcome: Progressing °  °

## 2023-01-28 NOTE — BH IP Treatment Plan (Signed)
Interdisciplinary Treatment and Diagnostic Plan Update  01/28/2023 Time of Session: 9:50 AM ( Update )  [facility] Elijah Ryan MRN: [id number]  Principal Diagnosis: Schizophrenia spectrum disorder with psychotic disorder type not yet determined [facility])  Secondary Diagnoses: Principal Problem:   Schizophrenia spectrum disorder with psychotic disorder type not yet determined (HCC) Active Problems:   Delta-9-tetrahydrocannabinol (THC) dependence (HCC)   History of methamphetamine abuse (HCC)   Insomnia   Current Medications:  Current Facility-Administered Medications  Medication Dose Route Frequency Provider Last Rate Last Admin   acetaminophen (TYLENOL) tablet 650 mg  650 mg Oral Q6H PRN Dixon, Rashaun M, NP       alum & mag hydroxide-simeth (MAALOX/MYLANTA) 200-200-20 MG/5ML suspension 30 mL  30 mL Oral Q4H PRN Dixon, Rashaun M, NP       benztropine (COGENTIN) tablet 0.5 mg  0.5 mg Oral Q12H Massengill, Nathan, MD   0.5 mg at 01/28/23 0759   haloperidol (HALDOL) tablet 5 mg  5 mg Oral TID PRN Phineas Inches, MD       And   LORazepam (ATIVAN) tablet 2 mg  2 mg Oral TID PRN Phineas Inches, MD   2 mg at 01/23/23 4098   And   diphenhydrAMINE (BENADRYL) capsule 50 mg  50 mg Oral TID PRN Phineas Inches, MD   50 mg at 01/23/23 1191   haloperidol lactate (HALDOL) injection 5 mg  5 mg Intramuscular TID PRN Phineas Inches, MD   5 mg at 01/23/23 2004   And   LORazepam (ATIVAN) injection 2 mg  2 mg Intramuscular TID PRN Phineas Inches, MD   2 mg at 01/23/23 2004   And   diphenhydrAMINE (BENADRYL) injection 50 mg  50 mg Intramuscular TID PRN Phineas Inches, MD   50 mg at 01/23/23 2003   feeding supplement (ENSURE ENLIVE / ENSURE PLUS) liquid 237 mL  237 mL Oral BID BM Massengill, Harrold Donath, MD   237 mL at 01/28/23 1441   haloperidol (HALDOL) 2 MG/ML solution 10 mg  10 mg Oral Q12H Massengill, Harrold Donath, MD   10 mg at 01/28/23 4782   Or   haloperidol lactate (HALDOL) injection 5  mg  5 mg Intramuscular Q12H Massengill, Harrold Donath, MD       hydrOXYzine (ATARAX) tablet 25 mg  25 mg Oral TID PRN Jearld Lesch, NP       magnesium hydroxide (MILK OF MAGNESIA) suspension 30 mL  30 mL Oral Daily PRN Jearld Lesch, NP       traZODone (DESYREL) tablet 50 mg  50 mg Oral QHS PRN Jearld Lesch, NP   50 mg at 01/27/23 2117   vitamin D3 (CHOLECALCIFEROL) tablet 1,000 Units  1,000 Units Oral Daily Phineas Inches, MD   1,000 Units at 01/28/23 0759   PTA Medications: No medications prior to admission.    Patient Stressors: Medication change or noncompliance    Patient Strengths: Manufacturing systems engineer  Supportive family/friends   Treatment Modalities: Medication Management, Group therapy, Case management,  1 to 1 session with clinician, Psychoeducation, Recreational therapy.   Physician Treatment Plan for Primary Diagnosis: Schizophrenia spectrum disorder with psychotic disorder type not yet determined (HCC) Long Term Goal(s): Improvement in symptoms so as ready for discharge   Short Term Goals: Ability to identify changes in lifestyle to reduce recurrence of condition will improve Ability to verbalize feelings will improve Ability to disclose and discuss suicidal ideas Ability to demonstrate self-control will improve Ability to identify and develop effective coping behaviors will  improve Ability to maintain clinical measurements within normal limits will improve Compliance with prescribed medications will improve Ability to identify triggers associated with substance abuse/mental health issues will improve  Medication Management: Evaluate patient's response, side effects, and tolerance of medication regimen.  Therapeutic Interventions: 1 to 1 sessions, Unit Group sessions and Medication administration.  Evaluation of Outcomes: Not Progressing  Physician Treatment Plan for Secondary Diagnosis: Principal Problem:   Schizophrenia spectrum disorder with psychotic  disorder type not yet determined (HCC) Active Problems:   Delta-9-tetrahydrocannabinol (THC) dependence (HCC)   History of methamphetamine abuse (HCC)   Insomnia  Long Term Goal(s): Improvement in symptoms so as ready for discharge   Short Term Goals: Ability to identify changes in lifestyle to reduce recurrence of condition will improve Ability to verbalize feelings will improve Ability to disclose and discuss suicidal ideas Ability to demonstrate self-control will improve Ability to identify and develop effective coping behaviors will improve Ability to maintain clinical measurements within normal limits will improve Compliance with prescribed medications will improve Ability to identify triggers associated with substance abuse/mental health issues will improve     Medication Management: Evaluate patient's response, side effects, and tolerance of medication regimen.  Therapeutic Interventions: 1 to 1 sessions, Unit Group sessions and Medication administration.  Evaluation of Outcomes: Not Progressing   RN Treatment Plan for Primary Diagnosis: Schizophrenia spectrum disorder with psychotic disorder type not yet determined (HCC) Long Term Goal(s): Knowledge of disease and therapeutic regimen to maintain health will improve  Short Term Goals: Ability to remain free from injury will improve, Ability to verbalize frustration and anger appropriately will improve, Ability to participate in decision making will improve, Ability to verbalize feelings will improve, Ability to identify and develop effective coping behaviors will improve, and Compliance with prescribed medications will improve  Medication Management: RN will administer medications as ordered by provider, will assess and evaluate patient's response and provide education to patient for prescribed medication. RN will report any adverse and/or side effects to prescribing provider.  Therapeutic Interventions: 1 on 1 counseling  sessions, Psychoeducation, Medication administration, Evaluate responses to treatment, Monitor vital signs and CBGs as ordered, Perform/monitor CIWA, COWS, AIMS and Fall Risk screenings as ordered, Perform wound care treatments as ordered.  Evaluation of Outcomes: Not Progressing   LCSW Treatment Plan for Primary Diagnosis: Schizophrenia spectrum disorder with psychotic disorder type not yet determined (HCC) Long Term Goal(s): Safe transition to appropriate next level of care at discharge, Engage patient in therapeutic group addressing interpersonal concerns.  Short Term Goals: Engage patient in aftercare planning with referrals and resources, Increase social support, Increase emotional regulation, Facilitate acceptance of mental health diagnosis and concerns, Identify triggers associated with mental health/substance abuse issues, and Increase skills for wellness and recovery  Therapeutic Interventions: Assess for all discharge needs, 1 to 1 time with Social worker, Explore available resources and support systems, Assess for adequacy in community support network, Educate family and significant other(s) on suicide prevention, Complete Psychosocial Assessment, Interpersonal group therapy.  Evaluation of Outcomes: Not Progressing  Progress in Treatment: Attending groups: No. Participating in groups: No. Taking medication as prescribed: Yes. Toleration medication: Yes. Family/Significant other contact made: Yes:  [name] Patient understands diagnosis: No. Discussing patient identified problems/goals with staff: Yes. Medical problems stabilized or resolved: Yes. Denies suicidal/homicidal ideation: Yes. Issues/concerns per patient self-inventory: No.     New problem(s) identified: No, Describe:  None reported    New Short Term/Long Term Goal(s):medication stabilization, elimination of SI thoughts, development  of comprehensive mental wellness plan.     Patient Goals:  "  I don't have any , I did not want to come here "    Discharge Plan or Barriers: Patient recently admitted. CSW will continue to follow and assess for appropriate referrals and possible discharge planning.     Reason for Continuation of Hospitalization: Aggression Hallucinations Medication stabilization   Estimated Length of Stay: 1-2 days   Last 3 [place] Suicide Severity Risk Score: Flowsheet Row [facility] (Current[facility]20[facility]  C-SSRS RISK CATEGORY High Risk No Risk       Last PHQ 2/9 Scores:     No data to display          Scribe for Treatment Team: Beather Arbour 01/28/2023 5:07 PM

## 2023-01-29 MED ORDER — BENZTROPINE MESYLATE 0.5 MG PO TABS: 0.5000 mg | ORAL_TABLET | Freq: Two times a day (BID) | ORAL | 0 refills | Status: AC

## 2023-01-29 MED ORDER — HALOPERIDOL 5 MG PO TABS: 5.0000 mg | ORAL_TABLET | Freq: Every day | ORAL | 0 refills | Status: AC

## 2023-01-29 MED ORDER — TRAZODONE HCL 50 MG PO TABS: 50.0000 mg | ORAL_TABLET | Freq: Every evening | ORAL | 0 refills | Status: AC | PRN

## 2023-01-29 MED ORDER — HALOPERIDOL 5 MG PO TABS: 5.0000 mg | ORAL_TABLET | Freq: Every day | ORAL | Status: DC

## 2023-01-29 MED ORDER — HALOPERIDOL 5 MG PO TABS
5.0000 mg | ORAL_TABLET | Freq: Every day | ORAL | Status: DC
  Filled 2023-01-29: qty 1

## 2023-01-29 MED ORDER — HYDROXYZINE HCL 25 MG PO TABS: 25.0000 mg | ORAL_TABLET | Freq: Three times a day (TID) | ORAL | 0 refills | Status: AC | PRN

## 2023-01-29 NOTE — Discharge Summary (Signed)
Physician Discharge Summary Note  Patient:  Elijah Ryan is an 37 y.o., male MRN:  [id number] DOB:  April 04, 1986 Patient phone:  [phone] (home)  Patient address:   [address] [place] [address],  Total Time spent with patient: 30 minutes  Date of Admission:  01/21/2023 Date of Discharge:   01/29/2023  Reason for Admission:  Elijah Ryan is a 37 yo Hispanic male with an unknown mental health history, who presented to the [facility] @ [place] on 6/09 under IVC taken out by his family members for manic type symptoms and psychosis; As per documentation from the ER, patient was manic, pacing, seeing devils, saying that he is God, and stating that he would take over the world etc. Patient was transferred on 6/10 by law enforcement to this behavioral health Hospital for treatment and stabilization of his mental status.    Principal Problem: Schizophrenia spectrum disorder with psychotic disorder type not yet determined [facility]) Discharge Diagnoses: Principal Problem:   Schizophrenia spectrum disorder with psychotic disorder type not yet determined (HCC) Active Problems:   Delta-9-tetrahydrocannabinol (THC) dependence (HCC)   History of methamphetamine abuse (HCC)   Insomnia  Past Psychiatric History: Previous Psych Diagnoses: Denies  Prior inpatient treatment: Denies  Current/prior outpatient treatment: Denies  Prior rehab hx: Denies  Psychotherapy hx: Denies  History of suicide attempts: Denies  History of homicide or aggression:  Denies  Psychiatric medication history: Never taken psychotropic medications Psychiatric medication compliance history: N/A Neuromodulation history: N/A Current Psychiatrist: Denies  Current therapist:  Denies   Past Medical History: History reviewed. No pertinent past medical history. History reviewed. No pertinent surgical history.  Family History: History reviewed. No pertinent family history.  Family Psychiatric  History: See  H&P  Social History:  Social History   Substance and Sexual Activity  Alcohol Use Not Currently     Social History   Substance and Sexual Activity  Drug Use Not Currently    Social History   Socioeconomic History   Marital status: Single    Spouse name: Not on file   Number of children: Not on file   Years of education: Not on file   Highest education level: Not on file  Occupational History   Not on file  Tobacco Use   Smoking status: Never   Smokeless tobacco: Never  Vaping Use   Vaping Use: Not on file  Substance and Sexual Activity   Alcohol use: Not Currently   Drug use: Not Currently   Sexual activity: Not on file  Other Topics Concern   Not on file  Social History Narrative   Not on file   Social Determinants of Health   Financial Resource Strain: Not on file  Food Insecurity: No Food Insecurity (01/21/2023)   Hunger Vital Sign    Worried About Running Out of Food in the Last Year: Never true    Ran Out of Food in the Last Year: Never true  Transportation Needs: No Transportation Needs (01/21/2023)   PRAPARE - Administrator, Civil Service (Medical): No    Lack of Transportation (Non-Medical): No  Physical Activity: Not on file  Stress: Not on file  Social Connections: Not on file    Hospital Course:  During the patient's hospitalization, patient had extensive initial psychiatric evaluation, and follow-up psychiatric evaluations every day.  Psychiatric diagnoses provided upon initial assessment:   Schizophrenia spectrum disorder with psychotic disorder type not yet determined (HCC)   Delta-9-tetrahydrocannabinol (THC) dependence (  HCC)   History of methamphetamine abuse (HCC)   Insomnia  Patient's psychiatric medications were adjusted on admission:  Haldol 5 mg twice daily for psychosis. Forced medication order in place, MUST give IM Haldol 5 mg if patient refuses Haldol 5 mg PO.  Hydroxyzine 25 mg p.o. 3 times daily as needed for  anxiety Trazodone 50 mg p.o. q. nightly as needed for insomnia Benztropine 1.5 mg every 12 hours for EPS symptoms  During the hospitalization, other adjustments were made to the patient's psychiatric medication regimen:  None  Patient's care was discussed during the interdisciplinary team meeting every day during the hospitalization.  The patient denies having side effects to prescribed psychiatric medication.  Gradually, patient started adjusting to milieu. The patient was evaluated each day by a clinical provider to ascertain response to treatment. Improvement was noted by the patient's report of decreasing symptoms, improved sleep and appetite, affect, medication tolerance, behavior, and participation in unit programming.  Patient was asked each day to complete a self inventory noting mood, mental status, pain, new symptoms, anxiety and concerns.    Symptoms were reported as significantly decreased or resolved completely by discharge.   On day of discharge, the patient reports that their mood is stable. The patient denied having suicidal thoughts for more than 48 hours prior to discharge.  Patient denies having homicidal thoughts.  Patient denies having auditory hallucinations.  Patient denies any visual hallucinations or other symptoms of psychosis. The patient was motivated to continue taking medication with a goal of continued improvement in mental health.   The patient reports their target psychiatric symptoms of psychosis responded well to the psychiatric medications, and the patient reports overall benefit other psychiatric hospitalization. Supportive psychotherapy was provided to the patient. The patient also participated in regular group therapy while hospitalized. Coping skills, problem solving as well as relaxation therapies were also part of the unit programming.  Labs were reviewed with the patient, and abnormal results were discussed with the patient.  The patient is able to  verbalize their individual safety plan to this provider.  # It is recommended to the patient to continue psychiatric medications as prescribed, after discharge from the hospital.    # It is recommended to the patient to follow up with your outpatient psychiatric provider and PCP.  # It was discussed with the patient, the impact of alcohol, drugs, tobacco have been there overall psychiatric and medical wellbeing, and total abstinence from substance use was recommended the patient.ed.  # Prescriptions provided or sent directly to preferred pharmacy at discharge. Patient agreeable to plan. Given opportunity to ask questions. Appears to feel comfortable with discharge.    # In the event of worsening symptoms, the patient is instructed to call the crisis hotline, 911 and or go to the nearest ED for appropriate evaluation and treatment of symptoms. To follow-up with primary care provider for other medical issues, concerns and or health care needs  # Patient was discharged to home with a plan to follow up as noted below.   Physical Findings: AIMS:  , ,  ,  ,    CIWA:    COWS:     Musculoskeletal: Strength & Muscle Tone: within normal limits Gait & Station: normal Patient leans: N/A  Psychiatric Specialty Exam:  Presentation  General Appearance:  Appropriate for Environment; Casual  Eye Contact: Good  Speech: Clear and Coherent  Speech Volume: Normal  Handedness: Right  Mood and Affect  Mood: Euthymic  Affect: Congruent  Thought Process  Thought Processes: Coherent; Goal Directed  Descriptions of Associations:Intact  Orientation:Full (Time, Place and Person)  Thought Content:Paranoid Ideation  History of Schizophrenia/Schizoaffective disorder:Yes  Duration of Psychotic Symptoms:Greater than six months  Hallucinations:Hallucinations: None Description of Auditory Hallucinations: Denies Description of Visual Hallucinations: Denies  Ideas of  Reference:Paranoia  Suicidal Thoughts:Suicidal Thoughts: No  Homicidal Thoughts:Homicidal Thoughts: No  Sensorium  Memory: Recent Good  Judgment: Fair  Insight: Fair  Art therapist  Concentration: Fair  Attention Span: Fair  Recall: [employer] of Knowledge: Fair  Language: Fair  Psychomotor Activity  Psychomotor Activity: Psychomotor Activity: Normal  Assets  Assets: Communication Skills; Desire for Improvement; Physical Health; Resilience  Sleep  Sleep: Sleep: Good Number of Hours of Sleep: 8  Physical Exam: Physical Exam Vitals and nursing note reviewed.  HENT:     Head: Normocephalic.     Nose: Nose normal.     Mouth/Throat:     Mouth: Mucous membranes are moist.  Eyes:     Pupils: Pupils are equal, round, and reactive to light.  Cardiovascular:     Rate and Rhythm: Normal rate.     Pulses: Normal pulses.  Pulmonary:     Effort: Pulmonary effort is normal.  Musculoskeletal:        General: Normal range of motion.     Cervical back: Normal range of motion.  Neurological:     General: No focal deficit present.     Mental Status: He is alert and oriented to person, place, and time.  Psychiatric:        Mood and Affect: Mood normal.        Behavior: Behavior normal.        Thought Content: Thought content normal.    Review of Systems  Constitutional:  Negative for chills and fever.  HENT:  Negative for sore throat.   Eyes:  Negative for blurred vision and double vision.  Respiratory:  Negative for shortness of breath.   Cardiovascular:  Negative for chest pain and palpitations.  Gastrointestinal:  Negative for abdominal pain, heartburn, nausea and vomiting.  Genitourinary: Negative.   Musculoskeletal: Negative.   Skin:  Negative for itching and rash.  Neurological:  Negative for dizziness, tingling, tremors and headaches.  Endo/Heme/Allergies:        See allergy listing  Psychiatric/Behavioral:  The patient is nervous/anxious  (Improved with medication) and has insomnia (Improved with medication).    Blood pressure 100/71, pulse 84, temperature 97.8 F (36.6 C), temperature source Oral, resp. rate 17, height 5' 6.5" (1.689 m), weight 61.4 kg, SpO2 100 %. Body mass index is 21.53 kg/m.   Social History   Tobacco Use  Smoking Status Never  Smokeless Tobacco Never   Tobacco Cessation:  N/A, patient does not currently use tobacco products   Blood Alcohol level:  Lab Results  Component Value Date   ETH <10 01/20/2023    Metabolic Disorder Labs:  Lab Results  Component Value Date   HGBA1C 5.6 01/23/2023   MPG 114.02 01/23/2023   No results found for: "PROLACTIN" Lab Results  Component Value Date   CHOL 221 (H) 01/23/2023   TRIG 178 (H) 01/23/2023   HDL 38 (L) 01/23/2023   CHOLHDL 5.8 01/23/2023   VLDL 36 01/23/2023   LDLCALC 147 (H) 01/23/2023    See Psychiatric Specialty Exam and Suicide Risk Assessment completed by Attending Physician prior to discharge.  Discharge destination:  Home  Is patient on multiple antipsychotic therapies at discharge:  No   Has Patient had three or more failed trials of antipsychotic monotherapy by history:  No  Recommended Plan for Multiple Antipsychotic Therapies: NA    Follow-up Information     [employer] Go on 02/01/2023.   Why: You have a hospital follow up appointment with this provider on 02/01/23 at 10:00 am.  The appointment will be held in person.  Following this appointment, you will be scheduled for a clinical assessment, to obtain therapy and medication management services.  * You may bring someone who can interpret for you, or they have an interpretation service available. Contact information: [address] [place] [address] [phone]         [employer]. Go to.   Why: Please go to walk in services:  Tuesdays and Thursdays: 2PM - 6PM Wednesdays: 9AM - 1PM or  Call to schedule an  appointment,  They can help with case management, legal services, educational services and job readiness,and emergency services for immigration services.  They have bilingual spanish, french and and english caseworkers to assist with services. Contact information: [address] [place], [place] [address] [phone]                Follow-up recommendations:   Discharge Recommendations:  The patient is being discharged with his family. Patient is to take his discharge medications as ordered.  See follow up above. We recommend that he participates in individual therapy to target uncontrollable agitation and substance abuse.  We recommend that he participates in family therapy to target the conflict with his family, to improve communication skills and conflict resolution skills.  Family is to initiate/implement a contingency based behavioral model to address patient's behavior. We recommend that he gets AIMS scale, height, weight, blood pressure, fasting lipid panel, fasting blood sugar in three months from discharge if he's on atypical antipsychotics.  Patient will benefit from monitoring of recurrent suicidal ideation since patient is on antidepressant medication. The patient should abstain from all illicit substances and alcohol.  If the patient's symptoms worsen or do not continue to improve or if the patient becomes actively suicidal or homicidal then it is recommended that the patient return to the closest hospital emergency room or call 911 for further evaluation and treatment. National Suicide Prevention Lifeline 1800-SUICIDE or [phone]. Please follow up with your primary medical doctor for all other medical needs.  The patient has been educated on the possible side effects to medications and he/his guardian is to contact a medical professional and inform outpatient provider of any new side effects of medication. He is to take regular diet and activity as tolerated.   Will benefit from moderate daily exercise. Patient/Family was educated about removing/locking any firearms, medications or dangerous products from the home.   Activity:  As tolerated Diet:  Regular Diet Signed: Cecilie Lowers, FNP 01/29/2023, 9:16 AM

## 2023-01-29 NOTE — Progress Notes (Signed)
D: Pt A & O X 3. Denies SI, HI, AVH and pain at this time. D/C home as ordered. Picked up in lobby by his wife. A: D/C instructions reviewed with pt including prescriptions and follow up appointment; compliance encouraged. All belongings from assigned locker and wallet from safe ($[phone]) returned to pt at time of departure. Scheduled  medications given with verbal education and effects monitored. Safety checks maintained without incident till time of d/c.  R: Pt receptive to care. Compliant with medications when offered. Denies adverse drug reactions when assessed. Verbalized understanding related to d/c instructions. Signed belonging sheet in agreement with items received from locker. Ambulatory with a steady gait. Appears to be in no physical distress at time of departure.

## 2023-01-29 NOTE — Progress Notes (Signed)
Adult Psychoeducational Group Note  Date:  01/29/2023 Time:  10:28 AM  Group Topic/Focus:  Goals Group:   The focus of this group is to help patients establish daily goals to achieve during treatment and discuss how the patient can incorporate goal setting into their daily lives to aide in recovery.  Participation Level:  Did Not Attend  Participation Quality:    Affect:    Cognitive:    Insight:   Engagement in Group:    Modes of Intervention:    Additional Comments:  Pt did not attend group  [employer] 01/29/2023, 10:28 AM

## 2023-01-29 NOTE — [facility] Suicide Risk Assessment (Addendum)
Suicide Risk Assessment  Discharge Assessment    [facility] Discharge Suicide Risk Assessment   Principal Problem: Schizophrenia spectrum disorder with psychotic disorder type not yet determined [facility]) Discharge Diagnoses:   Reason for admission:  Elijah Ryan is a 37 yo Hispanic male with an unknown mental health history, who presented to the [facility] @ [place] on 6/09 under IVC taken out by his family members for manic type symptoms and psychosis; As per documentation from the ER, patient was manic, pacing, seeing devils, saying that he is God, and stating that he would take over the world etc. Patient was transferred on 6/10 by law enforcement to this behavioral health Hospital for treatment and stabilization of his mental status.   Total Time spent with patient: 30 minutes  Musculoskeletal: Strength & Muscle Tone: within normal limits Gait & Station: normal Patient leans: N/A  Psychiatric Specialty Exam  Presentation  General Appearance:  Appropriate for Environment; Casual  Eye Contact: Good  Speech: Clear and Coherent  Speech Volume: Normal  Handedness: Right  Mood and Affect  Mood: Euthymic  Duration of Depression Symptoms: No data recorded Affect: Congruent  Thought Process  Thought Processes: Coherent; Goal Directed  Descriptions of Associations:Intact  Orientation:Full (Time, Place and Person)  Thought Content:Paranoid Ideation  History of Schizophrenia/Schizoaffective disorder:Yes  Duration of Psychotic Symptoms:Greater than six months  Hallucinations:Hallucinations: None Description of Auditory Hallucinations: Denies Description of Visual Hallucinations: Denies  Ideas of Reference:Paranoia  Suicidal Thoughts:Suicidal Thoughts: No  Homicidal Thoughts:Homicidal Thoughts: No  Sensorium  Memory: Recent Good  Judgment: Fair  Insight: Fair  Art therapist  Concentration: Fair  Attention Span: Fair  Recall: [employer]  of Knowledge: Fair  Language: Fair  Psychomotor Activity  Psychomotor Activity: Psychomotor Activity: Normal  Assets  Assets: Communication Skills; Desire for Improvement; Physical Health; Resilience  Sleep  Sleep: Sleep: Good Number of Hours of Sleep: 8  Physical Exam: Physical Exam HENT:     Head: Normocephalic.     Nose: Nose normal.     Mouth/Throat:     Mouth: Mucous membranes are moist.     Pharynx: Oropharynx is clear.  Eyes:     Extraocular Movements: Extraocular movements intact.     Pupils: Pupils are equal, round, and reactive to light.  Cardiovascular:     Rate and Rhythm: Normal rate.     Pulses: Normal pulses.  Pulmonary:     Effort: Pulmonary effort is normal.  Musculoskeletal:        General: Normal range of motion.     Cervical back: Normal range of motion.  Skin:    General: Skin is warm.  Neurological:     General: No focal deficit present.     Mental Status: He is alert and oriented to person, place, and time.  Psychiatric:        Mood and Affect: Mood normal.        Behavior: Behavior normal.        Thought Content: Thought content normal.    Review of Systems  Constitutional:  Negative for chills and fever.  HENT:  Negative for ear pain, hearing loss and tinnitus.   Eyes:  Negative for blurred vision, double vision and photophobia.  Respiratory:  Negative for cough, hemoptysis and sputum production.   Cardiovascular:  Negative for chest pain and palpitations.  Gastrointestinal:  Negative for abdominal pain, heartburn, nausea and vomiting.  Genitourinary: Negative.  Negative for dysuria, frequency, hematuria and urgency.  Musculoskeletal: Negative.  Skin:  Negative for itching and rash.  Neurological:  Negative for dizziness, tingling, tremors, sensory change, speech change and headaches.  Endo/Heme/Allergies:        See Allergy Listing  Psychiatric/Behavioral:  The patient is nervous/anxious (Improved with medication) and has  insomnia (Improved with medication).    Blood pressure 100/71, pulse 84, temperature 97.8 F (36.6 C), temperature source Oral, resp. rate 17, height 5' 6.5" (1.689 m), weight 61.4 kg, SpO2 100 %. Body mass index is 21.53 kg/m.  Mental Status Per Nursing Assessment::   On Admission:  NA  Demographic Factors:  Male, Caucasian, Low socioeconomic status, and Unemployed  Loss Factors: Decrease in vocational status and Financial problems/change in socioeconomic status  Historical Factors: Impulsivity  Risk Reduction Factors:   Living with another person, especially a relative, Positive social support, Positive therapeutic relationship, and Positive coping skills or problem solving skills  Continued Clinical Symptoms:  Depression:   Impulsivity Recent sense of peace/wellbeing Schizophrenia:   Less than 65 years old Paranoid or undifferentiated type More than one psychiatric diagnosis Previous Psychiatric Diagnoses and Treatments Medical Diagnoses and Treatments/Surgeries  Cognitive Features That Contribute To Risk:  Polarized thinking    Suicide Risk:  Mild:  Suicidal ideation of limited frequency, intensity, duration, and specificity.  There are no identifiable plans, no associated intent, mild dysphoria and related symptoms, good self-control (both objective and subjective assessment), few other risk factors, and identifiable protective factors, including available and accessible social support.   Follow-up Information     [facility] Go on 02/01/2023.   Why: You have a hospital follow up appointment with this provider on 02/01/23 at 10:00 am.  The appointment will be held in person.  Following this appointment, you will be scheduled for a clinical assessment, to obtain therapy and medication management services.  * You may bring someone who can interpret for you, or they have an interpretation service available. Contact information: [address] [place]  [place] [address] [phone]         [facility]. Go to.   Why: Please go to walk in services:  Tuesdays and Thursdays: 2PM - 6PM Wednesdays: 9AM - 1PM or  Call to schedule an appointment,  They can help with case management, legal services, educational services and job readiness,and emergency services for immigration services.  They have bilingual spanish, french and and english caseworkers to assist with services. Contact information: [address] [place], [place] [address] [phone]                Plan Of Care/Follow-up recommendations:  Discharge Recommendations:  The patient is being discharged with his family. Patient is to take his discharge medications as ordered.  See follow up above. We recommend that he participates in individual therapy to target uncontrollable agitation and substance abuse.  We recommend that he participates in family therapy to target the conflict with his family, to improve communication skills and conflict resolution skills.  Family is to initiate/implement a contingency based behavioral model to address patient's behavior. We recommend that he gets AIMS scale, height, weight, blood pressure, fasting lipid panel, fasting blood sugar in three months from discharge if he's on atypical antipsychotics.  Patient will benefit from monitoring of recurrent suicidal ideation since patient is on antidepressant medication. The patient should abstain from all illicit substances and alcohol.  If the patient's symptoms worsen or do not continue to improve or if the patient becomes actively suicidal or homicidal then it  is recommended that the patient return to the closest hospital emergency room or call 911 for further evaluation and treatment. National Suicide Prevention Lifeline 1800-SUICIDE or (949)726-2523. Please follow up with your primary medical doctor for all other medical needs.  The patient has been educated on the possible side  effects to medications and he/his guardian is to contact a medical professional and inform outpatient provider of any new side effects of medication. He is to take regular diet and activity as tolerated.  Will benefit from moderate daily exercise. Patient/Family was educated about removing/locking any firearms, medications or dangerous products from the home.   Activity:  As tolerated Diet:  Regular Diet Cecilie Lowers, FNP 01/29/2023, 8:56 AM

## 2023-01-29 NOTE — Progress Notes (Signed)
  [facility] Adult Case Management Discharge Plan :  Will you be returning to the same living situation after discharge:  Yes,  home with girlfriend At discharge, do you have transportation home?: Yes,  girlfriend picking patient up Do you have the ability to pay for your medications: Yes,  social support   Release of information consent forms completed and in the chart;  Patient's signature needed at discharge.  Patient to Follow up at:  Follow-up Information     [facility] Go on 02/01/2023.   Why: You have a hospital follow up appointment with this provider on 02/01/23 at 10:00 am.  The appointment will be held in person.  Following this appointment, you will be scheduled for a clinical assessment, to obtain therapy and medication management services.  * You may bring someone who can interpret for you, or they have an interpretation service available. Contact information: [address] [place] [address] [phone]         [facility]. Go to.   Why: Please go to walk in services:  Tuesdays and Thursdays: 2PM - 6PM Wednesdays: 9AM - 1PM or  Call to schedule an appointment,  They can help with case management, legal services, educational services and job readiness,and emergency services for immigration services.  They have bilingual spanish, french and and english caseworkers to assist with services. Contact information: [address] [place], [place] [address] [phone]                Next level of care provider has access to [facility] Link:no  Safety Planning and Suicide Prevention discussed: Yes,  girlfriend, [name]     Has patient been referred to the Quitline?: Patient does not use tobacco/nicotine products  Patient has been referred for addiction treatment: No known substance use disorder.  Elijah Ryan E Elijah [place], LCSW 01/29/2023, 9:00 AM

## 2023-01-29 NOTE — Progress Notes (Signed)
   01/29/23 0831  Psych Admission Type (Psych Patients Only)  Admission Status Involuntary  Psychosocial Assessment  Patient Complaints None  Eye Contact Fair  Facial Expression Animated  Affect Appropriate to circumstance  Speech Logical/coherent  Interaction Assertive  Motor Activity Other (Comment) (wdl)  Appearance/Hygiene In scrubs  Behavior Characteristics Cooperative  Mood Pleasant  Thought Process  Coherency WDL  Content WDL  Delusions None reported or observed  Perception WDL  Hallucination None reported or observed  Judgment WDL  Confusion None  Danger to Self  Current suicidal ideation? Denies  Agreement Not to Harm Self Yes  Description of Agreement verbal  Danger to Others  Danger to Others None reported or observed
